# Patient Record
Sex: Male | Born: 1970 | Race: White | Hispanic: Yes | Marital: Married | Smoking: Never smoker
Health system: Southern US, Community
[De-identification: ages and names within clinical notes are randomized; demographics above are authoritative.]

## PROBLEM LIST (undated history)

## (undated) DIAGNOSIS — M1990 Unspecified osteoarthritis, unspecified site: Secondary | ICD-10-CM

## (undated) DIAGNOSIS — E785 Hyperlipidemia, unspecified: Secondary | ICD-10-CM

## (undated) HISTORY — DX: Unspecified osteoarthritis, unspecified site: M19.90

---

## 2012-12-07 ENCOUNTER — Emergency Department (HOSPITAL_COMMUNITY): Payer: Worker's Compensation

## 2012-12-07 ENCOUNTER — Encounter (HOSPITAL_COMMUNITY): Payer: Self-pay | Admitting: Emergency Medicine

## 2012-12-07 ENCOUNTER — Emergency Department (HOSPITAL_COMMUNITY)
Admission: EM | Admit: 2012-12-07 | Discharge: 2012-12-08 | Disposition: A | Discharge status: Home / Self Care | Payer: Worker's Compensation | Attending: Emergency Medicine | Admitting: Emergency Medicine

## 2012-12-07 DIAGNOSIS — S0990XA Unspecified injury of head, initial encounter: Secondary | ICD-10-CM | POA: Insufficient documentation

## 2012-12-07 DIAGNOSIS — R109 Unspecified abdominal pain: Secondary | ICD-10-CM

## 2012-12-07 DIAGNOSIS — R55 Syncope and collapse: Secondary | ICD-10-CM | POA: Insufficient documentation

## 2012-12-07 DIAGNOSIS — R220 Localized swelling, mass and lump, head: Secondary | ICD-10-CM | POA: Insufficient documentation

## 2012-12-07 DIAGNOSIS — M545 Low back pain, unspecified: Secondary | ICD-10-CM | POA: Insufficient documentation

## 2012-12-07 DIAGNOSIS — S0180XA Unspecified open wound of other part of head, initial encounter: Secondary | ICD-10-CM

## 2012-12-07 DIAGNOSIS — S025XXA Fracture of tooth (traumatic), initial encounter for closed fracture: Secondary | ICD-10-CM | POA: Insufficient documentation

## 2012-12-07 DIAGNOSIS — S060X9A Concussion with loss of consciousness of unspecified duration, initial encounter: Secondary | ICD-10-CM

## 2012-12-07 DIAGNOSIS — M549 Dorsalgia, unspecified: Secondary | ICD-10-CM

## 2012-12-07 DIAGNOSIS — S060X0A Concussion without loss of consciousness, initial encounter: Secondary | ICD-10-CM

## 2012-12-07 DIAGNOSIS — R413 Other amnesia: Secondary | ICD-10-CM | POA: Insufficient documentation

## 2012-12-07 LAB — BASIC METABOLIC PANEL
CO2: 24 mEq/L (ref 19–32)
Calcium: 9.2 mg/dL (ref 8.4–10.5)
Chloride: 97 mEq/L (ref 96–112)
Glucose, Bld: 99 mg/dL (ref 70–99)
Sodium: 134 mEq/L — ABNORMAL LOW (ref 135–145)

## 2012-12-07 LAB — CBC WITH DIFFERENTIAL/PLATELET
Eosinophils Relative: 0 % (ref 0–5)
HCT: 44.1 % (ref 39.0–52.0)
Lymphocytes Relative: 15 % (ref 12–46)
Lymphs Abs: 2.5 10*3/uL (ref 0.7–4.0)
MCV: 83.2 fL (ref 78.0–100.0)
Monocytes Absolute: 0.8 10*3/uL (ref 0.1–1.0)
Neutro Abs: 12.9 10*3/uL — ABNORMAL HIGH (ref 1.7–7.7)
Platelets: 231 10*3/uL (ref 150–400)
RBC: 5.3 MIL/uL (ref 4.22–5.81)
WBC: 16.3 10*3/uL — ABNORMAL HIGH (ref 4.0–10.5)

## 2012-12-07 LAB — PROTIME-INR: Prothrombin Time: 13.2 seconds (ref 11.6–15.2)

## 2012-12-07 MED ORDER — PROMETHAZINE HCL 25 MG PO TABS: 25.0000 mg | ORAL_TABLET | Freq: Four times a day (QID) | ORAL | Status: DC | PRN

## 2012-12-07 MED ORDER — MORPHINE SULFATE 4 MG/ML IJ SOLN
4.0000 mg | Freq: Once | INTRAMUSCULAR | Status: AC
  Administered 2012-12-07: 4 mg via INTRAMUSCULAR
  Filled 2012-12-07: qty 1

## 2012-12-07 MED ORDER — HYDROMORPHONE HCL PF 1 MG/ML IJ SOLN
1.0000 mg | Freq: Once | INTRAMUSCULAR | Status: DC
  Filled 2012-12-07: qty 1

## 2012-12-07 MED ORDER — ONDANSETRON HCL 4 MG/2ML IJ SOLN
4.0000 mg | Freq: Once | INTRAMUSCULAR | Status: AC
  Administered 2012-12-07: 4 mg via INTRAVENOUS
  Filled 2012-12-07: qty 2

## 2012-12-07 MED ORDER — OXYCODONE-ACETAMINOPHEN 5-325 MG PO TABS
2.0000 | ORAL_TABLET | Freq: Once | ORAL | Status: AC
  Administered 2012-12-07: 2 via ORAL
  Filled 2012-12-07 (×2): qty 2

## 2012-12-07 MED ORDER — HYDROMORPHONE HCL PF 1 MG/ML IJ SOLN
1.0000 mg | Freq: Once | INTRAMUSCULAR | Status: AC
  Administered 2012-12-07: 1 mg via INTRAVENOUS
  Filled 2012-12-07: qty 1

## 2012-12-07 MED ORDER — HYDROCODONE-ACETAMINOPHEN 5-325 MG PO TABS
1.0000 | ORAL_TABLET | Freq: Once | ORAL | Status: AC
  Administered 2012-12-07: 1 via ORAL
  Filled 2012-12-07: qty 1

## 2012-12-07 MED ORDER — ONDANSETRON 4 MG PO TBDP
8.0000 mg | ORAL_TABLET | Freq: Once | ORAL | Status: AC
  Administered 2012-12-07: 8 mg via ORAL
  Filled 2012-12-07: qty 2

## 2012-12-07 MED ORDER — OXYCODONE-ACETAMINOPHEN 5-325 MG PO TABS: 2.0000 | ORAL_TABLET | Freq: Four times a day (QID) | ORAL | Status: DC | PRN

## 2012-12-07 MED ORDER — MORPHINE SULFATE 4 MG/ML IJ SOLN
4.0000 mg | Freq: Once | INTRAMUSCULAR | Status: AC
  Administered 2012-12-07: 4 mg via INTRAVENOUS
  Filled 2012-12-07: qty 1
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
## 2012-12-07 NOTE — ED Notes (Signed)
While pt was being wheeled out of ER pt had episode of emesis. Pt brought back to tx room and EDPA made aware. Pt stable, sitting in wheelchair, awake and calm. Family remains at bedside. Interpreter phone used to explain situation and medications
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
## 2012-12-07 NOTE — ED Notes (Signed)
During Discharge instructions, pt c/o increased pain and nausea, requesting medication prior to discharge. EDPA made aware, states pt ok for d/c after receiving medication

## 2012-12-07 NOTE — ED Notes (Signed)
Dr. Andrey Campanile at bedside with Interpreter phone
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
## 2012-12-07 NOTE — ED Notes (Signed)
Upon re-entering tx room, pt asleep in bed. Pt easily awakened. Asked pt to rate pain again, pt states his pain is now "Very Little". Medication held, EDPA aware

## 2012-12-08 MED ORDER — PROMETHAZINE HCL 25 MG/ML IJ SOLN
25.0000 mg | Freq: Once | INTRAMUSCULAR | Status: AC
  Administered 2012-12-08: 25 mg via INTRAMUSCULAR
  Filled 2012-12-08: qty 1

## 2012-12-08 NOTE — ED Notes (Signed)
Pt denies nausea at this time, is sitting up in wheelchair, alert, calm, interactive without issues. EDPA states ok for discharge

## 2012-12-08 NOTE — ED Notes (Addendum)
Pt had second episode of emesis.

## 2012-12-08 NOTE — ED Notes (Signed)
Pt reports "very little" nausea at this time, EDPA made aware. Pt placed into wheelchair per EDPA orders. Pt to be observed for 10 minutes to ensure nausea resolution.

## 2012-12-08 NOTE — ED Provider Notes (Signed)
Medical screening examination/treatment/procedure(s) were performed by non-physician practitioner and as supervising physician I was immediately available for consultation/collaboration.   Charles B. Bernette Mayers, MD 12/08/12 1327

## 2014-01-03 ENCOUNTER — Emergency Department (HOSPITAL_COMMUNITY)
Admission: EM | Admit: 2014-01-03 | Discharge: 2014-01-03 | Disposition: A | Discharge status: Home / Self Care | Payer: No Typology Code available for payment source | Attending: Emergency Medicine | Admitting: Emergency Medicine

## 2014-01-03 ENCOUNTER — Emergency Department (HOSPITAL_COMMUNITY): Payer: No Typology Code available for payment source

## 2014-01-03 ENCOUNTER — Encounter (HOSPITAL_COMMUNITY): Payer: Self-pay | Admitting: Emergency Medicine

## 2014-01-03 DIAGNOSIS — Z23 Encounter for immunization: Secondary | ICD-10-CM | POA: Insufficient documentation

## 2014-01-03 DIAGNOSIS — H1130 Conjunctival hemorrhage, unspecified eye: Secondary | ICD-10-CM | POA: Insufficient documentation

## 2014-01-03 DIAGNOSIS — S0510XA Contusion of eyeball and orbital tissues, unspecified eye, initial encounter: Secondary | ICD-10-CM | POA: Insufficient documentation

## 2014-01-03 DIAGNOSIS — S025XXA Fracture of tooth (traumatic), initial encounter for closed fracture: Secondary | ICD-10-CM | POA: Insufficient documentation

## 2014-01-03 DIAGNOSIS — S0993XA Unspecified injury of face, initial encounter: Secondary | ICD-10-CM

## 2014-01-03 DIAGNOSIS — S0003XA Contusion of scalp, initial encounter: Secondary | ICD-10-CM | POA: Insufficient documentation

## 2014-01-03 DIAGNOSIS — S199XXA Unspecified injury of neck, initial encounter: Secondary | ICD-10-CM

## 2014-01-03 DIAGNOSIS — S0083XA Contusion of other part of head, initial encounter: Secondary | ICD-10-CM

## 2014-01-03 DIAGNOSIS — S1093XA Contusion of unspecified part of neck, initial encounter: Principal | ICD-10-CM

## 2014-01-03 MED ORDER — NAPROXEN 500 MG PO TABS: 500.0000 mg | ORAL_TABLET | Freq: Two times a day (BID) | ORAL | Status: DC

## 2014-01-03 MED ORDER — OXYCODONE-ACETAMINOPHEN 5-325 MG PO TABS
2.0000 | ORAL_TABLET | Freq: Once | ORAL | Status: AC
  Administered 2014-01-03: 2 via ORAL
  Filled 2014-01-03: qty 2

## 2014-01-03 MED ORDER — TETANUS-DIPHTH-ACELL PERTUSSIS 5-2.5-18.5 LF-MCG/0.5 IM SUSP
0.5000 mL | Freq: Once | INTRAMUSCULAR | Status: AC
  Administered 2014-01-03: 0.5 mL via INTRAMUSCULAR
  Filled 2014-01-03: qty 0.5
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
## 2014-01-15 NOTE — ED Provider Notes (Signed)
Medical screening examination/treatment/procedure(s) were performed by non-physician practitioner and as supervising physician I was immediately available for consultation/collaboration.   EKG Interpretation None        Julie Manly, MD 01/15/14 0715 

## 2015-08-14 ENCOUNTER — Ambulatory Visit (INDEPENDENT_AMBULATORY_CARE_PROVIDER_SITE_OTHER): Payer: BLUE CROSS/BLUE SHIELD | Admitting: Family Medicine

## 2015-08-14 VITALS — BP 122/72 | HR 67 | Temp 98.1°F | Resp 17 | Ht 65.5 in | Wt 170.0 lb

## 2015-08-14 DIAGNOSIS — J209 Acute bronchitis, unspecified: Secondary | ICD-10-CM

## 2015-08-14 DIAGNOSIS — T1592XA Foreign body on external eye, part unspecified, left eye, initial encounter: Secondary | ICD-10-CM

## 2015-08-14 MED ORDER — HYDROCODONE-HOMATROPINE 5-1.5 MG/5ML PO SYRP: 5.0000 mL | ORAL_SOLUTION | Freq: Three times a day (TID) | ORAL | Status: DC | PRN

## 2015-08-14 MED ORDER — PREDNISONE 20 MG PO TABS: ORAL_TABLET | ORAL | Status: DC

## 2015-08-14 MED ORDER — AMOXICILLIN 875 MG PO TABS: 875.0000 mg | ORAL_TABLET | Freq: Two times a day (BID) | ORAL | Status: DC

## 2015-08-14 NOTE — Patient Instructions (Addendum)
Bronquitis aguda (Acute Bronchitis) La bronquitis es una inflamacin de las vas respiratorias que se extienden desde la trquea [facility] pulmones (bronquios). La inflamacin produce la formacin de mucosidad. Esto produce tos, que es el sntoma ms frecuente de la bronquitis.  Cuando la bronquitis es [place], generalmente comienza de [place] sbita y desaparece luego de un par de semanas. El hbito de fumar, las alergias y el asma pueden empeorar la bronquitis. Los episodios repetidos de bronquitis pueden causar ms problemas pulmonares.  CAUSAS La causa ms frecuente de bronquitis aguda es el mismo virus que produce el resfro. El virus puede propagarse de Ardelia Mems persona a la otra (contagioso) a travs de la tos y los estornudos, y al tocar objetos contaminados. [place].  Cristy Hilts.  Tos con mucosidad.  Dolores [employer] cuerpo.  Congestin en el pecho.  Escalofros.  Falta de aire.  Dolor de Investment banker, operational. DIAGNSTICO  La bronquitis aguda en general se diagnostica con un examen fsico. El mdico tambin le har preguntas sobre su historia clnica. En algunos casos se indican otros estudios, como radiografas, para Clinical research associate.  TRATAMIENTO  La bronquitis aguda generalmente desaparece en un par de semanas. Con frecuencia, no es Systems analyst. Los medicamentos se indican para aliviar la fiebre o la tos. Generalmente, no es necesario el uso de antibiticos, pero pueden recetarse en ciertas ocasiones. En algunos casos, se recomienda el uso de un inhalador para mejorar la falta de aire y Aeronautical engineer tos. Un vaporizador de aire fro podr ayudarlo a [employer] bronquiales y Armed forces technical officer su eliminacin.  INSTRUCCIONES PARA EL CUIDADO EN EL HOGAR  Descanse lo suficiente.  Beba lquidos en abundancia para mantener la orina de color claro o amarillo plido (excepto que padezca una enfermedad que requiera la restriccin de lquidos). El aumento  de lquidos puede ayudar a que las secreciones respiratorias (esputo) sean menos espesas y a reducir la congestin del pecho, y Mining engineer deshidratacin.  Tome los medicamentos solamente como se lo haya indicado el mdico.  Si le recetaron antibiticos, asegrese de terminarlos, incluso si comienza a sentirse mejor.  Evite fumar o aspirar el humo de otros fumadores. La exposicin al humo del cigarrillo o a irritantes qumicos har que la bronquitis empeore. Si fuma, considere el uso de goma de Higher education careers adviser o la aplicacin de parches en la piel que contengan nicotina para Public house manager los sntomas de abstinencia. Si deja de fumar, sus pulmones se curarn ms rpido.  Reduzca la probabilidad de otro episodio de bronquitis aguda lavando sus manos con frecuencia, evitando a las personas que tengan sntomas y tratando de no tocarse las manos con la boca, la nariz o los ojos.  Concurra a todas las visitas de control como se lo haya indicado el mdico. SOLICITE ATENCIN MDICA SI: Los sntomas no mejoran despus de una semana de [place].  SOLICITE ATENCIN MDICA DE INMEDIATO SI:  Comienza a tener fiebre o escalofros cada vez ms intensos.  Siente dolor en el pecho.  Le falta el aire de manera preocupante.  La flema tiene [place].  Se deshidrata.  Se desmaya o siente que va a desmayarse de forma repetida.  Tiene vmitos que se repiten.  Tiene un dolor de cabeza intenso. ASEGRESE DE QUE:   Comprende estas instrucciones.  Controlar su afeccin.  Recibir ayuda de inmediato si no mejora o si empeora.   Esta informacin no tiene Marine scientist el consejo del mdico. Asegrese de hacerle al mdico cualquier  pregunta que tenga.   Document Released: 04/29/2005 Document Revised: 05/20/2014 Elsevier Interactive Patient Education 2016 [employer] Acute Bronchitis Bronchitis is inflammation of the airways that extend from the windpipe into the lungs (bronchi). The inflammation often  causes mucus to develop. This leads to a cough, which is the most common symptom of bronchitis.  In acute bronchitis, the condition usually develops suddenly and goes away over time, usually in a couple weeks. Smoking, allergies, and asthma can make bronchitis worse. Repeated episodes of bronchitis may cause further lung problems.  CAUSES Acute bronchitis is most often caused by the same virus that causes a cold. The virus can spread from person to person (contagious) through coughing, sneezing, and touching contaminated objects. SIGNS AND SYMPTOMS   Cough.   Fever.   Coughing up mucus.   Body aches.   Chest congestion.   Chills.   Shortness of breath.   Sore throat.  DIAGNOSIS  Acute bronchitis is usually diagnosed through a physical exam. Your health care provider will also ask you questions about your medical history. Tests, such as chest X-rays, are sometimes done to rule out other conditions.  TREATMENT  Acute bronchitis usually goes away in a couple weeks. Oftentimes, no medical treatment is necessary. Medicines are sometimes given for relief of fever or cough. Antibiotic medicines are usually not needed but may be prescribed in certain situations. In some cases, an inhaler may be recommended to help reduce shortness of breath and control the cough. A cool mist vaporizer may also be used to help thin bronchial secretions and make it easier to clear the chest.  HOME CARE INSTRUCTIONS  Get plenty of rest.   Drink enough fluids to keep your urine clear or pale yellow (unless you have a medical condition that requires fluid restriction). Increasing fluids may help thin your respiratory secretions (sputum) and reduce chest congestion, and it will prevent dehydration.   Take medicines only as directed by your health care provider.  If you were prescribed an antibiotic medicine, finish it all even if you start to feel better.  Avoid smoking and secondhand smoke. Exposure  to cigarette smoke or irritating chemicals will make bronchitis worse. If you are a smoker, consider using nicotine gum or skin patches to help control withdrawal symptoms. Quitting smoking will help your lungs heal faster.   Reduce the chances of another bout of acute bronchitis by washing your hands frequently, avoiding people with cold symptoms, and trying not to touch your hands to your mouth, nose, or eyes.   Keep all follow-up visits as directed by your health care provider.  SEEK MEDICAL CARE IF: Your symptoms do not improve after 1 week of treatment.  SEEK IMMEDIATE MEDICAL CARE IF:  You develop an increased fever or chills.   You have chest pain.   You have severe shortness of breath.  You have bloody sputum.   You develop dehydration.  You faint or repeatedly feel like you are going to pass out.  You develop repeated vomiting.  You develop a severe headache. MAKE SURE YOU:   Understand these instructions.  Will watch your condition.  Will get help right away if you are not doing well or get worse.   This information is not intended to replace advice given to you by your health care provider. Make sure you discuss any questions you have with your health care provider.   Document Released: 06/06/2004 Document Revised: 05/20/2014 Document Reviewed: 10/20/2012 Elsevier Interactive Patient Education [employer]2016 Elsevier Inc.

## 2015-08-14 NOTE — Progress Notes (Signed)
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 He thinks he got a piece of dust or sand in his left eye yesterday when working.  He's been wheezing but has no history of smoking or asthma. He's has no fever but he does have sinus congestion.  Objective: Left eye was stained and a foreign body was removed from the underside of the lid. He has a small irritation on the lateral aspect of his cornea. EOM is normal. The eye is mildly injected. TMs are normal Nose shows bilateral mucopurulent discharge Chest: Diffuse loud rhonchi bilaterally Heart: Regular no murmur     ICD-9-CM ICD-10-CM   1. Acute bronchitis, unspecified organism 466.0 J20.9 amoxicillin (AMOXIL) 875 MG tablet     HYDROcodone-homatropine (HYCODAN) 5-1.5 MG/5ML syrup     predniSONE (DELTASONE) 20 MG tablet  2. Foreign body of left eye, initial encounter 930.9 T15.92XA    [id number]       Signed, Elvina SidleKurt Parks Czajkowski, MD

## 2016-03-15 LAB — GLUCOSE, POCT (MANUAL RESULT ENTRY): POC GLUCOSE: 119 mg/dL — AB (ref 70–99)

## 2017-01-14 ENCOUNTER — Ambulatory Visit (INDEPENDENT_AMBULATORY_CARE_PROVIDER_SITE_OTHER): Payer: BLUE CROSS/BLUE SHIELD | Admitting: Physician Assistant

## 2017-01-14 ENCOUNTER — Encounter: Payer: Self-pay | Admitting: Physician Assistant

## 2017-01-14 VITALS — BP 124/81 | HR 55 | Temp 98.3°F | Resp 16 | Ht 65.5 in

## 2017-01-14 DIAGNOSIS — Z23 Encounter for immunization: Secondary | ICD-10-CM

## 2017-01-14 DIAGNOSIS — S61211A Laceration without foreign body of left index finger without damage to nail, initial encounter: Secondary | ICD-10-CM | POA: Diagnosis not present

## 2017-01-14 NOTE — Patient Instructions (Addendum)
WOUND CARE Please return in 10 days to have your stitches/staples removed or sooner if you have concerns. . Keep area clean and dry for 24 hours. Do not remove bandage, if applied. . After 24 hours, remove bandage and wash wound gently with mild soap and warm water. Reapply a new bandage after cleaning wound, if directed. . Continue daily cleansing with soap and water until stitches/staples are removed. . Do not apply any ointments or creams to the wound while stitches/staples are in place, as this may cause delayed healing. . Notify the office if you experience any of the following signs of infection: Swelling, redness, pus drainage, streaking, fever >101.0 F . Notify the office if you experience excessive bleeding that does not stop after 15-20 minutes of constant, firm pressure.      IF you received an x-ray today, you will receive an invoice from [facility]. Please contact  Radiology at [phone] with questions or concerns regarding your invoice.   IF you received labwork today, you will receive an invoice from [facility]. Please contact [facility] at [phone] with questions or concerns regarding your invoice.   Our billing staff will not be able to assist you with questions regarding bills from these companies.  You will be contacted with the lab results as soon as they are available. The fastest way to get your results is to activate your My Chart account. Instructions are located on the last page of this paperwork. If you have not heard from us regarding the results in 2 weeks, please contact this office.      

## 2017-01-14 NOTE — Progress Notes (Signed)
    01/14/2017 10:28 AM   DOB: 07/26/1970 / MRN: [id number]  SUBJECTIVE:  Dakota Lin is a 46 y.o. male presenting for left index finger laceration sustained on a light bulb. No loss in strength.  No change in sensation.    He has No Known Allergies.   He  has no past medical history on file.    He  reports that he has never smoked. He has never used smokeless tobacco. He reports that he drinks alcohol. He  has no sexual activity history on file. The patient  has no past surgical history on file.  His family history is not on file.  Review of Systems  Neurological: Negative for dizziness and focal weakness.  Endo/Heme/Allergies: Does not bruise/bleed easily.    The problem list and medications were reviewed and updated by myself where necessary and exist elsewhere in the encounter.   OBJECTIVE:  BP 124/81   Pulse (!) 55   Temp 98.3 F (36.8 C) (Oral)   Resp 16   Ht 5' 5.5" (1.664 m)   SpO2 98%    Physical Exam  Cardiovascular: Normal rate.   Musculoskeletal:       Hands:  Risk and benefits discussed and verbal consent obtained. Anesthetic allergies reviewed. Patient anesthetized using 1:1 mix of 2% lidocaine with epi and Marcaine. The wound was cleansed thoroughly with soap and water. Sterile prep and drape. Wound closed with 5 throws using 4-0 Prolene suture material. Hemostasis achieved. Mupirocin applied to the wound and bandage placed. The patient tolerated well. Wound instructions were provided and the patient is to return in 10 days for suture removal.    No results found for this or any previous visit (from the past 72 hour(s)).  No results found.  ASSESSMENT AND PLAN:  Dakota Lin was seen today for laceration.  Diagnoses and all orders for this visit:  Laceration of left index finger without foreign body without damage to nail, initial encounter: Easily repaired. RTC 10 days fo suture removal.   Other orders -     Tdap vaccine greater than or equal to 7yo  IM    The patient is advised to call or return to clinic if he does not see an improvement in symptoms, or to seek the care of the closest emergency department if he worsens with the above plan.   Deliah BostonMichael [place], MHS, PA-C Primary [facility] at [facility] 01/14/2017 10:28 AM

## 2017-01-16 ENCOUNTER — Encounter: Payer: Self-pay | Admitting: Family Medicine

## 2017-01-16 ENCOUNTER — Ambulatory Visit (INDEPENDENT_AMBULATORY_CARE_PROVIDER_SITE_OTHER): Payer: BLUE CROSS/BLUE SHIELD | Admitting: Family Medicine

## 2017-01-16 VITALS — BP 126/80 | HR 63 | Temp 98.1°F | Resp 18 | Ht 65.75 in | Wt 185.6 lb

## 2017-01-16 DIAGNOSIS — Z0000 Encounter for general adult medical examination without abnormal findings: Secondary | ICD-10-CM | POA: Diagnosis not present

## 2017-01-16 DIAGNOSIS — Z23 Encounter for immunization: Secondary | ICD-10-CM

## 2017-01-16 NOTE — Progress Notes (Signed)
9/6/20189:05 AM  Dakota Lin 01/04/1971, 46 y.o. male [id number]  Chief Complaint  Patient presents with  . Establish Care    HPI:   Patient is a 46 y.o. male with no significant past medical history who presents today for annual physical. He has no acute concerns today. He is requesting routine blood work including prostate cancer screening given age.    [facility] 01/16/2017 01/14/2017 08/14/2015  Decreased Interest 0 0 0  Down, Depressed, Hopeless 0 0 0  PHQ - 2 Score 0 0 0    No Known Allergies  No current outpatient prescriptions on file prior to visit.   No current facility-administered medications on file prior to visit.     History reviewed. No pertinent past medical history.  History reviewed. No pertinent surgical history.  Social History  Substance Use Topics  . Smoking status: Never Smoker  . Smokeless tobacco: Never Used  . Alcohol use Yes     Comment: occ    Family History  Problem Relation Age of Onset  . Stroke Father     Review of Systems  Constitutional: Negative for chills and fever.  HENT: Negative for congestion, ear pain and sore throat.   Eyes: Negative for blurred vision.  Respiratory: Negative for cough and shortness of breath.   Cardiovascular: Negative for chest pain, palpitations and leg swelling.  Gastrointestinal: Positive for heartburn. Negative for abdominal pain, blood in stool, constipation, diarrhea, melena, nausea and vomiting.  Genitourinary: Positive for frequency. Negative for dysuria, hematuria and urgency.  Musculoskeletal: Negative for joint pain and myalgias.  Neurological: Negative for sensory change, speech change and focal weakness.  Endo/Heme/Allergies: Negative for polydipsia.  Psychiatric/Behavioral: Negative for depression. The patient is not nervous/anxious.      OBJECTIVE:  Blood pressure 126/80, pulse 63, temperature (!) 97.1 F (36.2 C), temperature source Oral, resp. rate 18, height 5'  5.75" (1.67 m), weight 185 lb 9.6 oz (84.2 kg), SpO2 97 %. Repeat Temp 98.1  Physical Exam  Constitutional: He is oriented to person, place, and time and well-developed, well-nourished, and in no distress.  HENT:  Head: Normocephalic and atraumatic.  Right Ear: Hearing, tympanic membrane, external ear and ear canal normal.  Left Ear: Hearing, tympanic membrane, external ear and ear canal normal.  Mouth/Throat: Oropharynx is clear and moist. No oropharyngeal exudate.  Eyes: Pupils are equal, round, and reactive to light. Conjunctivae and EOM are normal.  Neck: Neck supple.  Cardiovascular: Normal rate and regular rhythm.  Exam reveals no gallop and no friction rub.   No murmur heard. Pulmonary/Chest: Effort normal and breath sounds normal. He has no wheezes. He has no rales.  Abdominal: Soft. Bowel sounds are normal. He exhibits no distension and no mass. There is no tenderness.  Musculoskeletal: Normal range of motion. He exhibits no edema.  Lymphadenopathy:    He has no cervical adenopathy.  Neurological: He is alert and oriented to person, place, and time. He has normal reflexes. Gait normal.  Skin: Skin is warm and dry.       ASSESSMENT and PLAN:  1. Annual physical exam No concerns per history nor exam. Routine healthcare maintenance fasting labs done today. - CBC with Differential - Comprehensive metabolic panel - Lipid panel - TSH - PSA Total (Reflex To Free)  2. Need for immunization against influenza Given today - Flu Vaccine QUAD 36+ mos IM      Myles LippsIrma M Santiago, MD [facility] [address] [place] [address] Ph.  [phone] Fax [phone]

## 2017-01-16 NOTE — Patient Instructions (Signed)
     IF you received an x-ray today, you will receive an invoice from [facility]. Please contact  Radiology at [phone] with questions or concerns regarding your invoice.   IF you received labwork today, you will receive an invoice from [facility]. Please contact [facility] at [phone] with questions or concerns regarding your invoice.   Our billing staff will not be able to assist you with questions regarding bills from these companies.  You will be contacted with the lab results as soon as they are available. The fastest way to get your results is to activate your My Chart account. Instructions are located on the last page of this paperwork. If you have not heard from us regarding the results in 2 weeks, please contact this office.     

## 2017-01-17 ENCOUNTER — Encounter: Payer: Self-pay | Admitting: Family Medicine

## 2017-01-17 LAB — COMPREHENSIVE METABOLIC PANEL
ALT: 13 IU/L (ref 0–44)
AST: 11 IU/L (ref 0–40)
Albumin/Globulin Ratio: 1.3 (ref 1.2–2.2)
Albumin: 4.2 g/dL (ref 3.5–5.5)
Alkaline Phosphatase: 94 IU/L (ref 39–117)
BUN/Creatinine Ratio: 14 (ref 9–20)
BUN: 12 mg/dL (ref 6–24)
Bilirubin Total: 0.4 mg/dL (ref 0.0–1.2)
CO2: 22 mmol/L (ref 20–29)
Calcium: 9 mg/dL (ref 8.7–10.2)
Chloride: 106 mmol/L (ref 96–106)
Creatinine, Ser: 0.88 mg/dL (ref 0.76–1.27)
GFR calc Af Amer: 118 mL/min/{1.73_m2} (ref >59)
GFR calc non Af Amer: 102 mL/min/{1.73_m2} (ref >59)
Globulin, Total: 3.2 g/dL (ref 1.5–4.5)
Glucose: 91 mg/dL (ref 65–99)
Potassium: 4.6 mmol/L (ref 3.5–5.2)
Sodium: 142 mmol/L (ref 134–144)
Total Protein: 7.4 g/dL (ref 6.0–8.5)

## 2017-01-17 LAB — CBC WITH DIFFERENTIAL/PLATELET
Basophils Absolute: 0 10*3/uL (ref 0.0–0.2)
Basos: 0 %
EOS (ABSOLUTE): 0.2 10*3/uL (ref 0.0–0.4)
Eos: 2 %
Hematocrit: 43.5 % (ref 37.5–51.0)
Hemoglobin: 15 g/dL (ref 13.0–17.7)
Immature Grans (Abs): 0 10*3/uL (ref 0.0–0.1)
Immature Granulocytes: 0 %
Lymphocytes Absolute: 2.3 10*3/uL (ref 0.7–3.1)
Lymphs: 28 %
MCH: 29.1 pg (ref 26.6–33.0)
MCHC: 34.5 g/dL (ref 31.5–35.7)
MCV: 84 fL (ref 79–97)
Monocytes Absolute: 0.5 10*3/uL (ref 0.1–0.9)
Monocytes: 6 %
Neutrophils Absolute: 5.3 10*3/uL (ref 1.4–7.0)
Neutrophils: 64 %
Platelets: 251 10*3/uL (ref 150–379)
RBC: 5.16 x10E6/uL (ref 4.14–5.80)
RDW: 13.7 % (ref 12.3–15.4)
WBC: 8.2 10*3/uL (ref 3.4–10.8)

## 2017-01-17 LAB — PSA TOTAL (REFLEX TO FREE): Prostate Specific Ag, Serum: 0.2 ng/mL (ref 0.0–4.0)

## 2017-01-17 LAB — LIPID PANEL
Chol/HDL Ratio: 6.3 ratio — ABNORMAL HIGH (ref 0.0–5.0)
Cholesterol, Total: 196 mg/dL (ref 100–199)
HDL: 31 mg/dL — ABNORMAL LOW (ref >39)
LDL Calculated: 127 mg/dL — ABNORMAL HIGH (ref 0–99)
Triglycerides: 190 mg/dL — ABNORMAL HIGH (ref 0–149)
VLDL Cholesterol Cal: 38 mg/dL (ref 5–40)

## 2017-01-17 LAB — TSH: TSH: 1.98 u[IU]/mL (ref 0.450–4.500)

## 2017-01-23 ENCOUNTER — Encounter: Payer: Self-pay | Admitting: Urgent Care

## 2017-01-23 ENCOUNTER — Ambulatory Visit (INDEPENDENT_AMBULATORY_CARE_PROVIDER_SITE_OTHER): Payer: BLUE CROSS/BLUE SHIELD | Admitting: Urgent Care

## 2017-01-23 VITALS — BP 100/69 | Temp 98.6°F

## 2017-01-23 DIAGNOSIS — S61211D Laceration without foreign body of left index finger without damage to nail, subsequent encounter: Secondary | ICD-10-CM

## 2017-01-23 DIAGNOSIS — Z4802 Encounter for removal of sutures: Secondary | ICD-10-CM

## 2017-01-23 NOTE — Patient Instructions (Addendum)
Remocin de la sutura, cuidados posteriores (Suture Removal, Care After) Siga estas instrucciones durante las prximas semanas. Estas indicaciones le proporcionan informacin general acerca de cmo deber cuidarse despus del procedimiento. El mdico tambin podr darle instrucciones ms especficas. El tratamiento se ha planificado de acuerdo a las prcticas mdicas actuales, pero a veces se producen problemas. Comunquese con el mdico si tiene algn problema o tiene dudas despus del procedimiento. QU ESPERAR DESPUS DEL PROCEDIMIENTO Despus de que le retiren los puntos (suturas), es normal experimentar lo siguiente:  Molestias e hinchazn en la zona de la herida.  Leve enrojecimiento en la zona de la herida. INSTRUCCIONES PARA EL CUIDADO EN EL HOGAR  Si tiene bandas adhesivas en la piel sobre la zona de la herida, no las retire. Se caern solas en unos Hartford Financialpocos das. Si las bandas Agilent Technologiesadhesivas siguen en su lugar despus de [address]14 das, entonces puede retirarlas.  Cambie los apsitos (vendajes), al menos, una vez al da o segn las instrucciones del mdico. Si el vendaje se adhiere, remjelo con agua jabonosa tibia.  Solo aplique un ungento o crema segn las indicaciones del mdico. Si [place] crema o ungento, lave la zona con agua y [address] dos veces por da para quitarlo todo. Enjuague el jabn y seque suavemente la zona con una toalla limpia.  Mantenga la herida limpia y seca. Si el vendaje se moja, se ensucia o tiene mal olor, cmbielo tan pronto como pueda.  Contine protegiendo la herida de lesiones.  Use protector solar cuando salga al sol. Las cicatrices nuevas se broncean fcilmente.  SOLICITE ATENCIN MDICA SI:  Aumenta el enrojecimiento, la hinchazn o el dolor en la zona afectada.  Observa que sale pus de la herida.  Tiene fiebre.  Advierte un olor ftido que proviene de la herida o del vendaje.  La herida se abre (los bordes se separan).  Esta informacin no tiene Microbiologistcomo fin  reemplazar el consejo del mdico. Asegrese de hacerle al mdico cualquier pregunta que tenga. Document Released: 02/06/2005 Document Revised: 02/17/2013 Document Reviewed: 12/09/2012 Elsevier Interactive Patient Education  2017 [employer].     IF you received an x-ray today, you will receive an invoice from [facility]. Please contact [facility] at [phone] with questions or concerns regarding your invoice.   IF you received labwork today, you will receive an invoice from [facility]. Please contact [facility] at [phone] with questions or concerns regarding your invoice.   Our billing staff will not be able to assist you with questions regarding bills from these companies.  You will be contacted with the lab results as soon as they are available. The fastest way to get your results is to activate your My Chart account. Instructions are located on the last page of this paperwork. If you have not heard from us regarding the results in 2 weeks, please contact this office.

## 2017-01-23 NOTE — Progress Notes (Signed)
   Patient: Dakota Lin [id number]  Subjective: [name] is returning for suture removal. Patient was initially seen 01/14/2017 and had 5 sutures placed. Denies fever, drainage of pus or blood, wound dehiscence, edema, pain.   Objective: BP 100/69 (BP Location: Right Arm, Patient Position: Sitting, Cuff Size: Large)   Temp 98.6 F (37 C) (Oral)    Physical Exam  Constitutional: He is oriented to person, place, and time. He appears well-developed and well-nourished.  Cardiovascular: Normal rate.   Pulmonary/Chest: Effort normal.  Musculoskeletal:       Hands: Neurological: He is alert and oriented to person, place, and time.   #5 sutures removed without incident. Patient tolerated this well.  Assessment and Plan: Well-healed wound. Anticipatory guidance provided. Return to clinic as needed.  Wallis BambergMario Suriya Kovarik, PA-C [facility] [phone] 01/23/2017  8:16 AM

## 2017-05-22 ENCOUNTER — Ambulatory Visit: Payer: BLUE CROSS/BLUE SHIELD | Admitting: Urgent Care

## 2017-05-22 ENCOUNTER — Encounter: Payer: Self-pay | Admitting: Urgent Care

## 2017-05-22 ENCOUNTER — Ambulatory Visit (INDEPENDENT_AMBULATORY_CARE_PROVIDER_SITE_OTHER): Payer: BLUE CROSS/BLUE SHIELD

## 2017-05-22 VITALS — BP 126/60 | HR 71 | Temp 97.8°F | Resp 16 | Ht 65.0 in | Wt 180.0 lb

## 2017-05-22 DIAGNOSIS — H579 Unspecified disorder of eye and adnexa: Secondary | ICD-10-CM

## 2017-05-22 DIAGNOSIS — M25512 Pain in left shoulder: Secondary | ICD-10-CM | POA: Diagnosis not present

## 2017-05-22 DIAGNOSIS — M19012 Primary osteoarthritis, left shoulder: Secondary | ICD-10-CM | POA: Diagnosis not present

## 2017-05-22 DIAGNOSIS — R6889 Other general symptoms and signs: Secondary | ICD-10-CM

## 2017-05-22 DIAGNOSIS — R52 Pain, unspecified: Secondary | ICD-10-CM | POA: Diagnosis not present

## 2017-05-22 DIAGNOSIS — R0989 Other specified symptoms and signs involving the circulatory and respiratory systems: Secondary | ICD-10-CM | POA: Diagnosis not present

## 2017-05-22 LAB — POCT INFLUENZA A/B
Influenza A, POC: NEGATIVE
Influenza B, POC: NEGATIVE

## 2017-05-22 MED ORDER — PSEUDOEPHEDRINE HCL ER 120 MG PO TB12: 120.0000 mg | ORAL_TABLET | Freq: Two times a day (BID) | ORAL | 3 refills | Status: DC

## 2017-05-22 MED ORDER — AZELASTINE HCL 0.05 % OP SOLN: 1.0000 [drp] | Freq: Two times a day (BID) | OPHTHALMIC | 12 refills | Status: DC

## 2017-05-22 MED ORDER — MELOXICAM 7.5 MG PO TABS: 7.5000 mg | ORAL_TABLET | Freq: Every day | ORAL | 2 refills | Status: DC

## 2017-05-22 NOTE — Progress Notes (Signed)
MRN: [id number] DOB: 06/24/1970  Subjective:   Dakota Lin is a 47 y.o. male presenting for 2 chief complaints.  URI - Reports 2 day history of runny nose, fever (highest 105F), post-nasal drainage, body aches, sinus pain, loose stools (5 episodes yesterday, now improved). Reports foreign body sensation of his right eye, itching. Has tried APAP with relief of fever. Denies ear pain, ear drainage, cough, chest pain, shob, wheezing, n/v, abdominal pain, eye trauma, redness, photophobia, eye drainage, blurred vision.  Arm - Reports 6 month history of persistent left shoulder pain. Has used Advil with good relief. He can still lift items, use his shoulder but continues to have intermittent achy and sharp shoulder pain. Denies trauma, redness, swelling.   Dakota Lin is not currently taking any medications and has No Known Allergies.  Dakota Lin denies past medical and surgical history.   Objective:   Vitals: BP 126/60   Pulse 71   Temp 97.8 F (36.6 C) (Oral)   Resp 16   Ht 5\' 5"  (1.651 m)   Wt 180 lb (81.6 kg)   SpO2 98%   BMI 29.95 kg/m    Visual Acuity Screening   Right eye Left eye Both eyes  Without correction: 20/25 20/25 20/25   With correction:       Physical Exam  Constitutional: He is oriented to person, place, and time. He appears well-developed and well-nourished.  HENT:  TM's intact bilaterally, no effusions or erythema. Nasal turbinates erythematous, nasal passages patent. Mild bilateral maxillary sinus tenderness. Oropharynx with mild post-nasal drainage, mucous membranes moist.   Eyes: Lids are everted and swept, no foreign bodies found. Right eye exhibits no discharge. Left eye exhibits no discharge.  Right conjunctiva slightly injected.  Neck: Normal range of motion. Neck supple.  Cardiovascular: Normal rate.  Pulmonary/Chest: Effort normal.  Musculoskeletal:       Left shoulder: He exhibits tenderness (over AC joint). He exhibits normal range of motion, no bony  tenderness, no swelling, no effusion, no crepitus, no deformity, no laceration, no pain, no spasm and normal strength.  Unable to perform complete shoulder exam (Hawkin, Neer, Empty Beer Can tests) as patient was unable to follow exam techniques.  Lymphadenopathy:    He has no cervical adenopathy.  Neurological: He is alert and oriented to person, place, and time.  Skin: Skin is warm and dry.  Psychiatric:  Anxious demeanor.   Dg Shoulder Left  Result Date: 05/22/2017 CLINICAL DATA:  Left shoulder pain. EXAM: LEFT SHOULDER - 2+ VIEW COMPARISON:  No prior. FINDINGS: Mild acromioclavicular and glenohumeral degenerative change. No evidence of fracture or dislocation. No acute abnormality identified. IMPRESSION: Mild acromioclavicular glenohumeral degenerative change. No acute abnormality. Electronically Signed   By: Maisie Fushomas  Register   On: 05/22/2017 11:49   Results for orders placed or performed in visit on 05/22/17 (from the past 24 hour(s))  POCT Influenza A/B     Status: None   Collection Time: 05/22/17 11:39 AM  Result Value Ref Range   Influenza A, POC Negative Negative   Influenza B, POC Negative Negative   Eye Exam: Eyelids everted and swept for foreign body. The eye was stained with fluorescein. Examination under woods lamp does not reveal a foreign body or area of increased stain uptake. The eye was then irrigated copiously with saline.   Assessment and Plan :   Flu-like symptoms  Generalized body aches - Plan: POCT Influenza A/B  Sensation of foreign body in eye  Runny nose  Pain in joint of  left shoulder - Plan: DG Shoulder Left  Osteoarthritis of left shoulder, unspecified osteoarthritis type  Start supportive care for viral illness. Counseled on diagnosis of arthritis. Start meloxicam. Return-to-clinic precautions discussed, patient verbalized understanding.   Wallis Bamberg, PA-C Primary Care at [facility] [phone] 05/22/2017  11:04 AM

## 2017-05-22 NOTE — Patient Instructions (Addendum)
Para el dolor de garganta intente usar un t de miel. Use 3 cucharaditas de miel con jugo exprimido de [employer]. Coloque las piezas de [place] en 1/2 - 1 taza de agua y caliente sobre la estufa. Luego mezcle los ingredientes y repita cada 4 horas. Tome 500mg  de Tylenol cada 6 horas con comida para dolor y inflammacion.     Infeccin de las vas areas superiores en los adultos. Upper Respiratory Infection, Adult La mayora de las infecciones del tracto respiratorio superior son infecciones virales de las vas que llevan el aire a los pulmones. Un infeccin del tracto respiratorio superior afecta la nariz, la garganta y las vas respiratorias superiores. El tipo ms frecuente de infeccin del tracto respiratorio superior es la nasofaringitis, que habitualmente se conoce como "resfro comn". Las infecciones del tracto respiratorio superior siguen su curso y por lo general se curan solas. En la [employer], la infeccin del tracto respiratorio superior no requiere atencin [place], Biomedical engineer a veces, despus de una infeccin viral, puede surgir una infeccin bacteriana en las vas respiratorias superiores. Esto se conoce como infeccin secundaria. Las infecciones sinusales y en el odo medio son tipos frecuentes de infecciones secundarias en el tracto respiratorio superior. La neumona bacteriana tambin puede complicar una infeccin del tracto respiratorio superior. Este tipo de infeccin puede empeorar el asma y la enfermedad pulmonar obstructiva crnica (EPOC). En algunos casos, estas complicaciones pueden requerir atencin mdica de emergencia y poner en peligro la vida. Cules son las causas? Casi todas las infecciones del tracto respiratorio superior se deben a los virus. Un virus es un tipo de germen que puede contagiarse de Neomia Dear persona a Educational psychologist. Qu incrementa el riesgo? Puede estar en riesgo de sufrir una infeccin del tracto respiratorio superior si:  Fuma.  Tiene una  enfermedad pulmonar o cardaca crnica.  Tiene debilitado el sistema de defensa (inmunitario) del cuerpo.  Es [address] o de edad muy [place].  Tiene asma o alergias nasales.  Trabaja en reas donde hay mucha gente o poca ventilacin.  Rudi Coco en una escuela o en un centro de atencin mdica.  Cules son los signos o los sntomas? Habitualmente, los sntomas aparecen de 2a 3das despus de entrar en contacto con el virus del resfro. La mayora de las infecciones virales en el tracto respiratorio superior duran de 7a 10das. Sin embargo, las infecciones virales en el tracto respiratorio superior a causa del virus de la gripe pueden durar de 14a 18das y, habitualmente, son ms graves. Entre los sntomas se pueden incluir los siguientes:  Secrecin o congestin nasal.  Estornudos.  Tos.  Dolor de Advertising copywriter.  Dolor de [place].  Fatiga.  Grant Ruts.  Prdida del apetito.  Dolor en la frente, detrs de los ojos y por encima de los pmulos (dolor sinusal).  Dolores musculares.  Cmo se diagnostica? El mdico puede diagnosticar una infeccin del tracto respiratorio superior mediante los siguientes estudios:  Examen fsico.  Pruebas para verificar si los sntomas no se deben a otra afeccin, por ejemplo: ? Faringitis estreptoccica. ? Sinusitis. ? Neumona. ? Asma.  Cmo se trata? Esta infeccin desaparece sola con el tiempo. No puede curarse con medicamentos, pero a menudo se prescriben para aliviar los sntomas. Los medicamentos pueden ser tiles para lo siguiente:  Reduccin de la fiebre.  Reduccin de la tos.  Alivio de la congestin nasal.  Siga estas instrucciones en su casa:  Tome los medicamentos solamente como se lo haya indicado el mdico.  A fin  de Engineer, materials de garganta, haga grgaras con solucin salina templada o consuma caramelos para la tos segn lo que le haya indicado el mdico.  Use un humidificador con vapor clido o inhale el vapor  de la ducha para aumentar la humedad del aire. Esto facilitar la respiracin.  Beba suficiente lquido para Photographer orina clara o de color amarillo plido.  Consuma sopas y otros caldos transparentes, y Abbott Laboratories.  Descanse todo lo que sea necesario.  Regrese al Aleen Campi cuando la temperatura se le haya normalizado o segn lo que le indique el mdico. Es posible que deba quedarse en su casa durante un tiempo prolongado, para no infectar a los dems. Tambin puede usar un barbijo y lavarse las manos con cuidado para Transport planner propagacin del virus.  Aumente el uso del inhalador si tiene asma.  No consuma ningn producto que contenga tabaco, lo que incluye cigarrillos, tabaco de Theatre manager o Administrator, Civil Service. Si necesita ayuda para dejar de fumar, consulte al mdico. Cmo se evita? La mejor manera de protegerse de un resfro es con la prctica de una higiene [place].  Evite el contacto por va oral o a travs de las manos con personas que tengan sntomas de resfro.  En caso de contacto, lvese las manos con frecuencia.  No hay pruebas claras de que la vitaminaC, la vitaminaE, la equincea o el ejercicio reduzcan la probabilidad de Primary school teacher un resfro. Sin embargo, siempre se recomienda Insurance account manager, hacer ejercicio y Engineering geologist. Comunquese con un mdico si:  Su Training and development officer.  Los medicamentos no Estate agent.  Tiene escalofros.  Experimenta un empeoramiento en la falta de aire.  Tiene mucosidad marrn o roja.  Tiene secrecin nasal amarilla o marrn.  Le duele la cara, especialmente al inclinarse hacia adelante.  Tiene fiebre.  Tiene los ganglios del cuello hinchados.  Siente dolor al tragar.  Tiene zonas blancas en la parte de atrs de la garganta. Solicite ayuda de inmediato si:  Tiene sntomas intensos o persistentes de: ? Dolor de [place]. ? Dolor de odo. ? Dolor sinusal. ? Engineer, manufacturing.  Tiene enfermedad pulmonar crnica y cualquiera de estos sntomas: ? Sibilancias. ? Tos prolongada. ? Tos con Montez Hageman. ? Cambio en la mucosidad habitual.  Presenta rigidez en el cuello.  Tiene cambios en: ? La visin. ? La audicin. ? El pensamiento. ? El [place] de nimo. Esta informacin no tiene Theme park manager el consejo del mdico. Asegrese de hacerle al mdico cualquier pregunta que tenga. Document Released: 02/06/2005 Document Revised: 08/14/2016 Document Reviewed: 08/04/2013 Elsevier Interactive Patient Education  2018 [employer].     Artrosis Osteoarthritis La artrosis es un tipo de reumatismo articular que afecta el tejido que cubre los extremos de los huesos en las articulaciones (cartlago). El cartlago acta como amortiguador [employer] Corporation y los ayuda a moverse con suavidad. La artrosis se produce cuando el cartlago de las articulaciones se gasta. A veces, la artrosis se denomina reumatismo articular "por uso y desgaste". La artrosis es la forma ms frecuente de reumatismo articular. A menudo, afecta a las Smith International. Es una enfermedad que empeora con el tiempo (una enfermedad progresiva). Esta enfermedad afecta con ms frecuencia las articulaciones de:  Los dedos de Washington Mutual.  Los dedos Avaya.  Las caderas.  Las rodillas.  La columna vertebral, incluido el cuello y la zona lumbar.  Cules son las causas? Esta enfermedad es causada por el  desgaste del cartlago que cubre los extremos de los [place]uesos, lo cual tiene relacin con la edad. Qu incrementa el riesgo? Los siguientes factores pueden hacer que usted sea ms propenso a tener esta enfermedad:  Edad avanzada.  Tener exceso de [place]peso u obesidad.  Uso excesivo de las articulaciones, como en el caso de los Ocean Viewatletas.  Lesin pasada de Risk analystuna articulacin.  Ciruga pasada en una articulacin.  Antecedentes familiares de artrosis.  Cules son los signos o los sntomas? Los  principales sntomas de esta enfermedad son dolor, hinchazn y Geophysical data processorrigidez en la articulacin. Con el tiempo, la articulacin puede perder su forma. Se pueden desprender pequeos trozos de Dow Chemicalhueso o cartlago y flotar dentro de la articulacin, lo cual puede causar ms dolor y Clinical cytogeneticistdao en la articulacin. Pueden formarse pequeos depsitos de hueso (ostefitos) en los extremos de Nurse, learning disabilityla articulacin. Otros sntomas pueden incluir lo siguiente:  Una sensacin de chirrido o raspado dentro de la articulacin al moverla.  Sonidos de chasquido o crujido al Clorox Companymoverse.  Los sntomas pueden afectar una o ms articulaciones. La artrosis en una articulacin principal, como la rodilla o la cadera, puede causar dolor al caminar o al realizar ejercicio. Si tiene artrosis Jabil Circuiten las manos, es posible que no pueda agarrar objetos, torcer la mano o controlar pequeos movimientos de las manos y los dedos (motricidad fina). Cmo se diagnostica? Esta enfermedad se puede diagnosticar en funcin de lo siguiente:  Sus antecedentes mdicos.  Un examen fsico.  Sus sntomas.  Radiografas de la(s) articulacin(es) afectada(s).  Anlisis de sangre para descartar otros tipos de reumatismo articular.  Cmo se trata? No hay cura para esta enfermedad, pero el tratamiento puede ayudar a Human resources officercontrolar el dolor y Scientist, clinical (histocompatibility and immunogenetics)mejorar el funcionamiento de Nurse, learning disabilityla articulacin. Los planes de tratamiento pueden incluir lo siguiente:  Un programa de ejercicios indicado que permita el descanso y el alivio de la articulacin. Puede trabajar con un fisioterapeuta.  Un plan de control del peso.  Tcnicas de Occidental Petroleumalivio del dolor, como las siguientes: ? Aplicacin de calor y fro en la articulacin. ? Impulsos elctricos aplicados a las terminaciones nerviosas que se encuentran debajo de la piel (neuroestimulacin elctrica transcutnea [TENS]). ? Masajes. ? Ciertos suplementos nutricionales.  Antiiflamatorios no esteroideos o medicamentos recetados para ayudar a  Engineer, materialsaliviar el dolor.  Medicamentos para ayudar a Engineer, materialsaliviar el dolor y la inflamacin (corticoesteroides). Estos se pueden administrar por boca (va oral) o mediante una inyeccin.  Dispositivos de [place] Grenadinesayuda, como un dispositivo ortopdico, una frula, un guante especial o un bastn.  Ciruga, como: ? Carolin SicksUna osteotoma. Se hace para reposicionar los huesos y Engineer, materialsaliviar el dolor o para retirar los trozos sueltos de hueso y TEFL teachercartlago. ? Ciruga de reemplazo articular. Es posible que necesite esta ciruga si tiene una artrosis muy grave (avanzada).  Siga estas instrucciones en su casa: Actividad  Haga descansar las articulaciones afectadas segn las indicaciones del mdico.  No conduzca ni use maquinaria pesada mientras toma analgsicos recetados.  Practique los ejercicios que le indiquen. Es posible que el mdico o el fisioterapeuta le recomienden tipos especficos de ejercicios, tales como: ? Ejercicios de fortalecimiento. Se realizan para fortalecer los msculos que sostienen las articulaciones afectadas por el reumatismo. Pueden realizarse con peso o con bandas para agregar resistencia. ? Actividades Rhona Raideraerbicas. Son ejercicios, como caminar a paso ligero o hacer gimnasia Cook Islandsaerbica acutica, que aumentan la actividad del corazn. ? Actividades de amplitud de movimientos. Facilitan el movimiento de las articulaciones. ? Ejercicios de equilibrio y Russian Federationagilidad. Control del dolor, la rigidez  y la hinchazn  Si se lo indican, aplique calor en la zona afectada tan frecuentemente como se lo haya indicado el mdico. Use la fuente de calor que el mdico le recomiende, como una compresa de calor hmedo o una almohadilla trmica. ? Si tiene un dispositivo de ayuda que se puede quitar, quteselo segn lo indicado por su mdico. ? Colquese una toalla entre la piel y la fuente de Airline pilot. Si el mdico le indica que no se quite el dispositivo de USAA se Tax inspector, coloque una toalla entre el dispositivo de [place] y  la fuente de Airline pilot. ? Aplique el calor durante 20 a . ? Retire la fuente de calor si la piel se le pone de color rojo brillante. Esto es muy importante si no puede sentir el dolor, el calor ni el fro. Puede correr un riesgo mayor de sufrir quemaduras.  Si se lo indican, aplique hielo sobre la articulacin afectada: ? Si tiene un dispositivo de ayuda que se puede quitar, quteselo segn lo indicado por su mdico. ? Ponga el hielo en una bolsa plstica. ? Colquese una FirstEnergy Corp piel y la bolsa de hielo. Si el mdico le indica que no se quite el dispositivo de USAA se aplica hielo, coloque una toalla entre el dispositivo de [place] y la bolsa de hielo. ? Coloque el hielo durante , 2 o 3veces por da. Instrucciones generales  Baxter International de venta libre y los recetados solamente como se lo haya indicado el mdico.  Mantenga un peso saludable. Siga las instrucciones de su mdico con respecto al control del [place]. Estas pueden incluir restricciones en la dieta.  No consuma ningn producto que contenga nicotina o tabaco, como cigarrillos y Administrator, Civil Service. Estos pueden retrasar la consolidacin del [place]. Si necesita ayuda para dejar de fumar, consulte al American Express.  Use los dispositivos de American Express se lo haya indicado el mdico.  Concurra a todas las visitas de control como se lo haya indicado el mdico. Esto es importante. Dnde encontrar ms informacin:  Training and development officer de Reumatismo Articular y Event organiser Musculoesquelticas y Arboriculturist [facility] of Arthritis and Musculoskeletal and Skin Diseases): www.niams.http://www.myers.net/  Instituto [employer] el Envejecimiento ([employer] on Aging): https://walker.com/  Instituto Estadounidense de Advice worker of Rheumatology): www.rheumatology.org Comunquese con un mdico si:  La piel se pone roja.  Le aparece una erupcin cutnea.  Siente un dolor que  [place].  Tiene fiebre y siente dolor en la articulacin o el msculo. Solicite ayuda de inmediato si:  [employer] Utilities.  Pierde el apetito repentinamente.  Tiene sudoracin nocturna. Resumen  La artrosis es un tipo de reumatismo articular que afecta el tejido que cubre los extremos de los huesos en las articulaciones (cartlago).  Esta enfermedad es causada por el desgaste del cartlago que cubre los extremos de [place], lo cual tiene relacin con la edad.  Los principales sntomas de esta enfermedad son dolor, hinchazn y Geophysical data processor.  No hay cura para esta enfermedad, pero el tratamiento puede ayudar a Human resources officer y Scientist, clinical (histocompatibility and immunogenetics) el funcionamiento de Nurse, learning disability. Esta informacin no tiene Theme park manager el consejo del mdico. Asegrese de hacerle al mdico cualquier pregunta que tenga. Document Released: 02/06/2005 Document Revised: 03/25/2016 Document Reviewed: 01/04/2013 Elsevier Interactive Patient Education  2018 Arvin[employer]itor.     IF you received an x-ray today, you will receive an invoice from [facility]. Please contact [facility] at [phone] with questions or concerns  regarding your invoice.   IF you received labwork today, you will receive an invoice from [facility]. Please contact [facility] at [phone] with questions or concerns regarding your invoice.   Our billing staff will not be able to assist you with questions regarding bills from these companies.  You will be contacted with the lab results as soon as they are available. The fastest way to get your results is to activate your My Chart account. Instructions are located on the last page of this paperwork. If you have not heard from [place] regarding the results in 2 weeks, please contact this office.

## 2017-09-26 ENCOUNTER — Other Ambulatory Visit: Payer: Self-pay

## 2017-09-26 ENCOUNTER — Encounter: Payer: Self-pay | Admitting: Family Medicine

## 2017-09-26 ENCOUNTER — Ambulatory Visit: Payer: BLUE CROSS/BLUE SHIELD | Admitting: Family Medicine

## 2017-09-26 VITALS — BP 114/80 | HR 73 | Temp 98.3°F | Ht 64.57 in | Wt 179.8 lb

## 2017-09-26 DIAGNOSIS — H538 Other visual disturbances: Secondary | ICD-10-CM | POA: Diagnosis not present

## 2017-09-26 NOTE — Patient Instructions (Addendum)
.   Favor de ir al [employer], al parecer pueda que necesite espejuelos    IF you received an x-ray today, you will receive an invoice from [facility]. Please contact [facility] at [phone] with questions or concerns regarding your invoice.   IF you received labwork today, you will receive an invoice from [place]. Please contact [facility] at [phone] with questions or concerns regarding your invoice.   Our billing staff will not be able to assist you with questions regarding bills from these companies.  You will be contacted with the lab results as soon as they are available. The fastest way to get your results is to activate your My Chart account. Instructions are located on the last page of this paperwork. If you have not heard from [place] regarding the results in 2 weeks, please contact this office.

## 2017-09-26 NOTE — Progress Notes (Signed)
   5/17/201910:58 AM  Dakota Lin Jan 31, 1971, 46 y.o. male [id number]  Chief Complaint  Patient presents with  . Blurred Vision    HPI:   Patient is a 47 y.o. male who presents today for blurry vision. Reports that he has slowly been noticing things not looking as sharp, specially at night Denies any photophobia, eye pain, eye redness, FB sensation, drainage  Has not seen an eye doctor  Fall Risk  09/26/2017 05/22/2017 01/23/2017 01/16/2017 01/14/2017  Falls in the past year? No No No No No     Depression screen [facility] 2/9 09/26/2017 05/22/2017 01/23/2017  Decreased Interest 0 0 -  Down, Depressed, Hopeless 0 - 0  PHQ - 2 Score 0 0 0    No Known Allergies  Prior to Admission medications   Medication Sig Start Date End Date Taking? Authorizing Provider    History reviewed. No pertinent past medical history.  History reviewed. No pertinent surgical history.  Social History   Tobacco Use  . Smoking status: Never Smoker  . Smokeless tobacco: Never Used  Substance Use Topics  . Alcohol use: Yes    Comment: occ    Family History  Problem Relation Age of Onset  . Stroke Father     Review of Systems  Constitutional: Negative for chills, fever and malaise/fatigue.  Eyes: Positive for blurred vision. Negative for double vision, photophobia, pain, discharge and redness.  Gastrointestinal: Negative for nausea.  Neurological: Negative for headaches.     OBJECTIVE:  Blood pressure 114/80, pulse 73, temperature 98.3 F (36.8 C), temperature source Oral, height 5' 4.57" (1.64 m), weight 179 lb 12.8 oz (81.6 kg), SpO2 97 %.   Visual Acuity Screening   Right eye Left eye Both eyes  Without correction:  With correction:       Physical Exam  Constitutional: He is oriented to person, place, and time. He appears well-developed and well-nourished.  HENT:  Head: Normocephalic and atraumatic.  Mouth/Throat: Oropharynx is clear and moist.  Eyes: Pupils are  equal, round, and reactive to light. Conjunctivae, EOM and lids are normal. Right eye exhibits no discharge. Left eye exhibits no discharge.  Neck: Neck supple.  Cardiovascular: Normal rate and regular rhythm. Exam reveals no gallop and no friction rub.  No murmur heard. Pulmonary/Chest: Effort normal and breath sounds normal. He has no wheezes. He has no rales.  Neurological: He is alert and oriented to person, place, and time.  Skin: Skin is warm and dry.  Psychiatric: He has a normal mood and affect.  Nursing note and vitals reviewed.    ASSESSMENT and PLAN  1. Blurry vision Recommended patient get a formal eye exam  Return if symptoms worsen or fail to improve.    Myles Lipps, MD Primary Care at [facility] [address] [place], [place] [address] Ph.  [phone] Fax [phone]

## 2017-11-19 ENCOUNTER — Emergency Department (HOSPITAL_COMMUNITY): Payer: BLUE CROSS/BLUE SHIELD

## 2017-11-19 ENCOUNTER — Emergency Department (HOSPITAL_COMMUNITY)
Admission: EM | Admit: 2017-11-19 | Discharge: 2017-11-19 | Disposition: A | Discharge status: Home / Self Care | Payer: BLUE CROSS/BLUE SHIELD | Attending: Emergency Medicine | Admitting: Emergency Medicine

## 2017-11-19 ENCOUNTER — Other Ambulatory Visit: Payer: Self-pay

## 2017-11-19 ENCOUNTER — Encounter (HOSPITAL_COMMUNITY): Payer: Self-pay | Admitting: Emergency Medicine

## 2017-11-19 DIAGNOSIS — M5441 Lumbago with sciatica, right side: Secondary | ICD-10-CM | POA: Diagnosis not present

## 2017-11-19 DIAGNOSIS — R52 Pain, unspecified: Secondary | ICD-10-CM

## 2017-11-19 DIAGNOSIS — M545 Low back pain: Secondary | ICD-10-CM | POA: Diagnosis present

## 2017-11-19 DIAGNOSIS — M5442 Lumbago with sciatica, left side: Secondary | ICD-10-CM

## 2017-11-19 MED ORDER — IBUPROFEN 400 MG PO TABS: 400.0000 mg | ORAL_TABLET | Freq: Four times a day (QID) | ORAL | 0 refills | Status: DC | PRN

## 2017-11-19 MED ORDER — IBUPROFEN 400 MG PO TABS
600.0000 mg | ORAL_TABLET | Freq: Once | ORAL | Status: AC
  Administered 2017-11-19: 600 mg via ORAL
  Filled 2017-11-19: qty 1

## 2017-11-19 MED ORDER — METHOCARBAMOL 500 MG PO TABS: 500.0000 mg | ORAL_TABLET | Freq: Two times a day (BID) | ORAL | 0 refills | Status: DC

## 2017-11-19 NOTE — ED Notes (Signed)
Pt updated on POC and new scans ordered.  Pt's family now at bedside

## 2017-11-19 NOTE — ED Notes (Signed)
Pt transported to xray 
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
## 2017-11-19 NOTE — ED Notes (Signed)
Pt understood dc material. NAD noted. [facility]s [facility] at [facility]. Stratus interp used for discharge
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
## 2017-11-19 NOTE — ED Notes (Signed)
Pt. Going to xray  

## 2017-11-19 NOTE — Discharge Instructions (Addendum)
Please follow-up with [place] [facility] regarding your visit today. Please return to the emergency department for any new or worsening symptoms. Please do not drive or operate heavy machinery while taking Robaxin.  SOLICITE ATENCIN MDICA SI: El dolor y otros sntomas empeoran. El medicamento no IT trainerle calma el dolor. El dolor no mejora despus de unas semanas de cuidado Facilities manageren el hogar. Tiene fiebre. SOLICITE ATENCIN MDICA DE INMEDIATO SI: Tiene dolor, adormecimiento o debilidad intensos. Tiene dificultad con el control de la vejiga o los intestinos. SOLICITE AYUDA SI: Siente dolor que no se alivia con reposo o medicamentos. Siente cada vez ms dolor que se extiende a las piernas o los glteos. El dolor no mejora en una semana. Siente dolor por la noche. Pierde peso. Siente escalofros o fiebre. SOLICITE AYUDA DE INMEDIATO SI: No puede controlar su materia fecal (heces) o el pis (orina). Siente debilidad en las piernas o los brazos. Siente prdida de la sensibilidad (adormecimiento) en las piernas o los brazos. Tiene malestar estomacal (nuseas) o vomita. Siente dolor de estmago (abdominal). Siente que se desvanece (se desmaya). SOLICITE AYUDA SI: Los sntomas empeoran. Tiene cualquiera de los siguientes sntomas durante ms de Marsh & McLennandos semanas despus del accidente automovilstico: Dolores de cabeza que perduran (crnicos). Mareos o problemas de equilibrio. Ganas de vomitar (nuseas). Problemas de visin. Ms sensibilidad a los ruidos o a Statisticianla luz. Depresin y cambios en el estado de nimo. Estar preocupado o nervioso (ansioso). Se molesta o se enoja con facilidad. Problemas de memoria. Dificultad para prestar atencin o concentrarse. Problemas para dormir. Cansancio permanente. SOLICITE AYUDA DE INMEDIATO SI: Tiene los siguientes sntomas: Adormecimiento, hormigueo o debilidad en los brazos o las piernas. Dolor intenso en el cuello, especialmente con la palpacin en el medio  de la parte posterior del cuello. Un cambio en la capacidad de controlar la miccin (orina) o la defecacin (heces). Aumento del dolor en cualquier parte del cuerpo. Falta de aire o sensacin de desvanecimiento. Dolor en el pecho. Sangre en la orina, en las heces o en el vmito. Dolor muy intenso en el vientre (abdomen) o en la espalda. Dolores de Turkmenistancabeza muy intensos o que empeoran. Prdida repentina de la visin o visin doble. El ojo se enrojece repentinamente. La parte negra situada en el centro del ojo (pupila) tiene una forma o un tamao extraos. Esta informacin no tiene Theme park managercomo fin reemplazar el consejo del mdico. Asegrese de hacerle al mdico cualquier pregunta que tenga.

## 2018-02-20 ENCOUNTER — Other Ambulatory Visit: Payer: Self-pay

## 2018-02-20 ENCOUNTER — Ambulatory Visit: Payer: BLUE CROSS/BLUE SHIELD | Admitting: Emergency Medicine

## 2018-02-20 ENCOUNTER — Encounter: Payer: Self-pay | Admitting: Emergency Medicine

## 2018-02-20 VITALS — BP 126/78 | HR 74 | Temp 98.0°F | Resp 16 | Wt 180.0 lb

## 2018-02-20 DIAGNOSIS — R030 Elevated blood-pressure reading, without diagnosis of hypertension: Secondary | ICD-10-CM | POA: Diagnosis not present

## 2018-02-20 DIAGNOSIS — R55 Syncope and collapse: Secondary | ICD-10-CM

## 2018-02-20 DIAGNOSIS — Z23 Encounter for immunization: Secondary | ICD-10-CM | POA: Diagnosis not present

## 2018-02-20 NOTE — Progress Notes (Signed)
Dakota Lin 47 y.o.   Chief Complaint  Patient presents with  . Hypertension    pt states his B/p has been elavated    BP Readings from Last 3 Encounters:  02/20/18 126/78  11/19/17 120/88  09/26/17 114/80    HISTORY OF PRESENT ILLNESS: This is Lin 47 y.o. male felt very lightheaded and had Lin near syncopal episode yesterday while he was working in in his homes crawl space.  Went to Lin nearby clinic and was told his blood pressure was Lin little high in the range of 140/90.  Advised to see his PCP.  Today he feels fine.  Asymptomatic.  Has no complaints.  No history of hypertension.  Has never been on medication for it.  HPI   Prior to Admission medications   Medication Sig Start Date End Date Taking? Authorizing Provider  ibuprofen (ADVIL,MOTRIN) 400 MG tablet Take 1 tablet (400 mg total) by mouth every 6 (six) hours as needed. 11/19/17  Yes Dakota Salts Lin, Dakota Lin  methocarbamol (ROBAXIN) 500 MG tablet Take 1 tablet (500 mg total) by mouth 2 (two) times daily. 11/19/17  Yes Dakota Salts Lin, Dakota Lin    No Known Allergies  There are no active problems to display for this patient.   History reviewed. No pertinent past medical history.  History reviewed. No pertinent surgical history.  Social History   Socioeconomic History  . Marital status: Married    Spouse name: Not on file  . Number of children: Not on file  . Years of education: Not on file  . Highest education level: Not on file  Occupational History  . Not on file  Social Needs  . Financial resource strain: Not on file  . Food insecurity:    Worry: Not on file    Inability: Not on file  . Transportation needs:    Medical: Not on file    Non-medical: Not on file  Tobacco Use  . Smoking status: Never Smoker  . Smokeless tobacco: Never Used  Substance and Sexual Activity  . Alcohol use: Yes    Comment: occ  . Drug use: No  . Sexual activity: Yes  Lifestyle  . Physical activity:    Days per week: Not on  file    Minutes per session: Not on file  . Stress: Not on file  Relationships  . Social connections:    Talks on phone: Not on file    Gets together: Not on file    Attends religious service: Not on file    Active member of club or organization: Not on file    Attends meetings of clubs or organizations: Not on file    Relationship status: Not on file  . Intimate partner violence:    Fear of current or ex partner: Not on file    Emotionally abused: Not on file    Physically abused: Not on file    Forced sexual activity: Not on file  Other Topics Concern  . Not on file  Social History Narrative  . Not on file    Family History  Problem Relation Age of Onset  . Stroke Father      Review of Systems  Constitutional: Negative.  Negative for chills and fever.  HENT: Negative.  Negative for sore throat.   Eyes: Negative.  Negative for blurred vision.  Respiratory: Negative.  Negative for cough and shortness of breath.   Cardiovascular: Negative.  Negative for chest pain and palpitations.  Gastrointestinal: Negative for  abdominal pain, diarrhea, nausea and vomiting.  Genitourinary: Negative.   Musculoskeletal: Negative for back pain, myalgias and neck pain.  Skin: Negative.  Negative for rash.  Neurological: Negative.  Negative for dizziness and headaches.  Endo/Heme/Allergies: Negative.   All other systems reviewed and are negative.   Vitals:   02/20/18 1402  BP: 126/78  Pulse: 74  Resp: 16  Temp: 98 F (36.7 C)  SpO2: 97%     Physical Exam  Constitutional: He is oriented to person, place, and time. He appears well-developed and well-nourished.  HENT:  Head: Normocephalic and atraumatic.  Nose: Nose normal.  Mouth/Throat: Oropharynx is clear and moist.  Eyes: Pupils are equal, round, and reactive to light. Conjunctivae and EOM are normal.  Neck: Normal range of motion. Neck supple. No JVD present. No thyromegaly present.  Cardiovascular: Normal rate, regular  rhythm and normal heart sounds.  Pulmonary/Chest: Effort normal and breath sounds normal.  Musculoskeletal: Normal range of motion. He exhibits no edema.  Lymphadenopathy:    He has no cervical adenopathy.  Neurological: He is alert and oriented to person, place, and time. No sensory deficit. He exhibits normal muscle tone. Coordination normal.  Skin: Skin is warm and dry. Capillary refill takes less than 2 seconds.  Vitals reviewed.  Lin total of 25 minutes was spent in the room with the patient, greater than 50% of which was in counseling/coordination of care regarding differential diagnosis, management, prognosis, and need for follow-up if no better or worse.   ASSESSMENT & PLAN: Dakota Lin was seen today for hypertension.  Diagnoses and all orders for this visit:  Near syncope  Need for prophylactic vaccination and inoculation against influenza -     Flu Vaccine QUAD 36+ mos IM  Transient elevated blood pressure    Patient Instructions       If you have lab work done today you will be contacted with your lab results within the next 2 weeks.  If you have not heard from [place] then please contact us. The fastest way to get your results is to register for My Chart.   IF you received an x-ray today, you will receive an invoice from [facility]. Please contact [facility] at [phone] with questions or concerns regarding your invoice.   IF you received labwork today, you will receive an invoice from [place]. Please contact [facility] at [phone] with questions or concerns regarding your invoice.   Our billing staff will not be able to assist you with questions regarding bills from these companies.  You will be contacted with the lab results as soon as they are available. The fastest way to get your results is to activate your My Chart account. Instructions are located on the last page of this paperwork. If you have not heard from [place] regarding the results in 2  weeks, please contact this office.      Presncope (Near-Syncope) El presncope ocurre cuando, sbitamente, se siente dbil, mareado o como si se fuera Lin Artist. Durante un episodio de presncope, usted puede [employer] siguientes sntomas:  Sentirse mareado o como si fuera Lin Barista.  Sentir Programme researcher, broadcasting/film/video (nuseas).  Ver todo blanco o negro.  Tener la piel fra y hmeda. Si se desmay, solicite ayuda inmediata.Comunquese con el servicio de emergencias de su localidad (911en los [place]). No conduzca por sus propios medios [facility]. CUIDADOS EN EL HOGAR Est atento Lin cualquier cambio en los sntomas. Tome estas medidas para controlar la afeccin:  Pdale  Lin alguien que se quede con usted hasta que se sienta estable.  No conduzca vehculos, no use maquinarias ni practique deportes hasta que el mdico lo autorice.  Concurra Lin todas las visitas de control como se lo haya indicado el mdico. Esto es importante.  Si comienza Lin sentir que [employer], recustese de inmediato y levante (eleve) los pies por encima del nivel del corazn. Respire profundamente y de [place] continua. Espere hasta que los sntomas hayan desaparecido.  Beba suficiente lquido para mantener el pis (orina) claro o de color amarillo plido.  Si est tomando un medicamento para la presin arterial o para el corazn, pngase de pie lentamente, tmese algunos minutos para permanecer sentado y luego prese. Esto puede reducir [employer].  Tome los medicamentos de venta libre y los recetados solamente como se lo haya indicado el mdico. SOLICITE AYUDA DE INMEDIATO SI:  Siente un dolor de cabeza muy intenso.  Siente dolor fuera de lo normal en el pecho, el abdomen o la espalda.  Le sangra la boca o el recto.  La materia fecal (heces) es negra o de aspecto alquitranado.  Los latidos son muy rpidos o irregulares (palpitaciones).  Se desmay una o varias veces.  Tiene movimientos  espasmdicos que no puede controlar (convulsiones).  Se siente confundido.  Presenta dificultad para caminar.  Se siente muy dbil.  Tiene problemas de visin. Estos sntomas pueden Customer service manager. No espere hasta que los sntomas desaparezcan. Solicite atencin mdica de inmediato. Comunquese con el servicio de emergencias de su localidad (911en los [place]). No conduzca por sus propios medios [employer]. Esta informacin no tiene Theme park manager el consejo del mdico. Asegrese de hacerle al mdico cualquier pregunta que tenga. Document Released: 07/26/2008 Document Revised: 08/21/2015 Document Reviewed: 01/11/2015 Elsevier Interactive Patient Education  2017 Elsevier Inc.      Edwina Barth, MD [facility]

## 2018-02-20 NOTE — Patient Instructions (Addendum)
If you have lab work done today you will be contacted with your lab results within the next 2 weeks.  If you have not heard from [place] then please contact us. The fastest way to get your results is to register for My Chart.   IF you received an x-ray today, you will receive an invoice from [facility]. Please contact [facility] at [phone] with questions or concerns regarding your invoice.   IF you received labwork today, you will receive an invoice from [place]. Please contact [facility] at [phone] with questions or concerns regarding your invoice.   Our billing staff will not be able to assist you with questions regarding bills from these companies.  You will be contacted with the lab results as soon as they are available. The fastest way to get your results is to activate your My Chart account. Instructions are located on the last page of this paperwork. If you have not heard from [place] regarding the results in 2 weeks, please contact this office.      Presncope (Near-Syncope) El presncope ocurre cuando, sbitamente, se siente dbil, mareado o como si se fuera a Artist. Durante un episodio de presncope, usted puede [employer] siguientes sntomas:  Sentirse mareado o como si fuera a Barista.  Sentir Programme researcher, broadcasting/film/video (nuseas).  Ver todo blanco o negro.  Tener la piel fra y hmeda. Si se desmay, solicite ayuda inmediata.Comunquese con el servicio de emergencias de su localidad (911en los [place]). No conduzca por sus propios medios [facility]. CUIDADOS EN EL HOGAR Est atento a cualquier cambio en los sntomas. Tome estas medidas para controlar la afeccin:  Pdale a alguien que se quede con usted hasta que se sienta estable.  No conduzca vehculos, no use maquinarias ni practique deportes hasta que el mdico lo autorice.  Concurra a todas las visitas de control como se lo haya indicado el mdico. Esto es importante.  Si  comienza a sentir que [employer], recustese de inmediato y levante (eleve) los pies por encima del nivel del corazn. Respire profundamente y de [facility] continua. Espere hasta que los sntomas hayan desaparecido.  Beba suficiente lquido para mantener el pis (orina) claro o de color amarillo plido.  Si est tomando un medicamento para la presin arterial o para el corazn, pngase de pie lentamente, tmese algunos minutos para permanecer sentado y luego prese. Esto puede reducir [employer].  Tome los medicamentos de venta libre y los recetados solamente como se lo haya indicado el mdico. SOLICITE AYUDA DE INMEDIATO SI:  Siente un dolor de cabeza muy intenso.  Siente dolor fuera de lo normal en el pecho, el abdomen o la espalda.  Le sangra la boca o el recto.  La materia fecal (heces) es negra o de aspecto alquitranado.  Los latidos son muy rpidos o irregulares (palpitaciones).  Se desmay una o varias veces.  Tiene movimientos espasmdicos que no puede controlar (convulsiones).  Se siente confundido.  Presenta dificultad para caminar.  Se siente muy dbil.  Tiene problemas de visin. Estos sntomas pueden Customer service manager. No espere hasta que los sntomas desaparezcan. Solicite atencin mdica de inmediato. Comunquese con el servicio de emergencias de su localidad (911en los [place]). No conduzca por sus propios medios [employer]. Esta informacin no tiene Theme park manager el consejo del mdico. Asegrese de hacerle al mdico cualquier pregunta que tenga. Document Released: 07/26/2008 Document Revised: 08/21/2015 Document Reviewed: 01/11/2015 Elsevier Interactive Patient Education  2017  [employer].

## 2018-10-02 ENCOUNTER — Encounter (HOSPITAL_COMMUNITY): Payer: Self-pay | Admitting: Emergency Medicine

## 2018-10-02 ENCOUNTER — Other Ambulatory Visit: Payer: Self-pay

## 2018-10-02 ENCOUNTER — Ambulatory Visit (HOSPITAL_COMMUNITY)
Admission: EM | Admit: 2018-10-02 | Discharge: 2018-10-02 | Disposition: A | Discharge status: Home / Self Care | Payer: BLUE CROSS/BLUE SHIELD | Attending: Internal Medicine | Admitting: Internal Medicine

## 2018-10-02 DIAGNOSIS — R51 Headache: Secondary | ICD-10-CM

## 2018-10-02 DIAGNOSIS — R509 Fever, unspecified: Secondary | ICD-10-CM | POA: Diagnosis not present

## 2018-10-02 DIAGNOSIS — M791 Myalgia, unspecified site: Secondary | ICD-10-CM | POA: Diagnosis not present

## 2018-10-02 DIAGNOSIS — U071 COVID-19: Secondary | ICD-10-CM

## 2018-10-02 DIAGNOSIS — R05 Cough: Secondary | ICD-10-CM

## 2018-10-02 NOTE — ED Notes (Signed)
Patient able to ambulate independently  

## 2018-10-02 NOTE — ED Triage Notes (Signed)
Pt presents to [facility] for assessmnt of 3 days of body aches, fever which is relieved by tylenol but returns a few hours later, wet cough, and fatigue.

## 2018-10-04 NOTE — ED Provider Notes (Signed)
[facility]    CSN: [id number] Arrival date & time: 10/02/18  0849     History   Chief Complaint Chief Complaint  Patient presents with  . Flu-Like Symptoms    HPI Dakota NicolasSantos Lin is a 48 y.o. male no past medical history comes to urgent care with complaints of fever, headaches, body aches, fatigue and nonproductive cough.  Patient symptoms started about 3 days ago and is been persistent.  He comes to urgent care to be evaluated.  Patient's father-in-law and his wife came to this urgent care yesterday and was diagnosed with COVID-19 infection.  Patient lives in the same house with above-mentioned family members.  He denies any nausea vomiting.  He has not tried any over-the-counter medications.   HPI  History reviewed. No pertinent past medical history.  Patient Active Problem List   Diagnosis Date Noted  . Near syncope 02/20/2018    History reviewed. No pertinent surgical history.     Home Medications    Prior to Admission medications   Medication Sig Start Date End Date Taking? Authorizing Provider  ibuprofen (ADVIL,MOTRIN) 400 MG tablet Take 1 tablet (400 mg total) by mouth every 6 (six) hours as needed. 11/19/17   Harlene SaltsMorelli, Brandon A, PA-C  methocarbamol (ROBAXIN) 500 MG tablet Take 1 tablet (500 mg total) by mouth 2 (two) times daily. 11/19/17   Bill SalinasMorelli, Brandon A, PA-C    Family History Family History  Problem Relation Age of Onset  . Stroke Father     Social History Social History   Tobacco Use  . Smoking status: Never Smoker  . Smokeless tobacco: Never Used  Substance Use Topics  . Alcohol use: Yes    Comment: occ  . Drug use: No     Allergies   Patient has no known allergies.   Review of Systems Review of Systems  Constitutional: Positive for activity change, appetite change, chills, fatigue and fever.  HENT: Positive for congestion and sore throat. Negative for facial swelling, hearing loss, mouth sores, nosebleeds, postnasal drip,  rhinorrhea, sinus pressure and sinus pain.   Eyes: Negative.   Respiratory: Positive for cough. Negative for shortness of breath.   Gastrointestinal: Negative for abdominal distention, abdominal pain, nausea and vomiting.  Genitourinary: Negative for dysuria, frequency and urgency.  Musculoskeletal: Positive for arthralgias and myalgias.  Neurological: Positive for headaches. Negative for dizziness and syncope.     Physical Exam Triage Vital Signs ED Triage Vitals  Enc Vitals Group     BP 10/02/18 0919 (!) 128/96     Pulse Rate 10/02/18 0919 92     Resp 10/02/18 0919 18     Temp 10/02/18 0919 98.7 F (37.1 C)     Temp Source 10/02/18 0919 Oral     SpO2 10/02/18 0919 99 %     Weight --      Height --      Head Circumference --      Peak Flow --      Pain Score 10/02/18 0921 9     Pain Loc --      Pain Edu? --      Excl. in GC? --    No data found.  Updated Vital Signs BP (!) 128/96 (BP Location: Left Arm)   Pulse 92   Temp 98.7 F (37.1 C) (Oral) Comment: 1.5 hr ago took Tylenol  Resp 18   SpO2 99%   Visual Acuity Right Eye Distance:   Left Eye Distance:   Bilateral  Distance:    Right Eye Near:   Left Eye Near:    Bilateral Near:     Physical Exam Constitutional:      General: He is in acute distress.     Appearance: He is ill-appearing. He is not toxic-appearing.  Eyes:     Conjunctiva/sclera: Conjunctivae normal.  Neck:     Musculoskeletal: No neck rigidity or muscular tenderness.  Cardiovascular:     Rate and Rhythm: Normal rate and regular rhythm.     Pulses: Normal pulses.     Heart sounds: Normal heart sounds.  Pulmonary:     Effort: Pulmonary effort is normal. No respiratory distress.     Breath sounds: Normal breath sounds. No stridor. No rhonchi.  Abdominal:     General: Bowel sounds are normal. There is no distension.     Palpations: Abdomen is soft.     Tenderness: There is no abdominal tenderness. There is no guarding.  Musculoskeletal:  Normal range of motion.        General: No swelling or tenderness.  Skin:    General: Skin is warm.     Capillary Refill: Capillary refill takes less than 2 seconds.  Neurological:     Mental Status: He is alert.      UC Treatments / Results  Labs (all labs ordered are listed, but only abnormal results are displayed) Labs Reviewed - No data to display  EKG None  Radiology No results found.  Procedures Procedures (including critical care time)  Medications Ordered in UC Medications - No data to display  Initial Impression / Assessment and Plan / UC Course  I have reviewed the triage vital signs and the nursing notes.  Pertinent labs & imaging results that were available during my care of the patient were reviewed by me and considered in my medical decision making (see chart for details).     1.  COVID-19 infection: COVID-19 patient discharge instructions given Patient is encouraged to self quarantine Tylenol/Motrin for fever, body aches or headaches Virtual visits if patient has any further questions or symptom changes If patient develops severe respiratory distress, altered mentation or intractable nausea/vomiting he will need to reach out to urgent care or ED for evaluation. Final Clinical Impressions(s) / UC Diagnoses   Final diagnoses:  COVID-19 virus infection   Discharge Instructions   None    ED Prescriptions    None     Controlled Substance Prescriptions [place] Controlled Substance Registry consulted? No   Merrilee Jansky, MD 10/04/18 [phone]

## 2018-10-19 ENCOUNTER — Other Ambulatory Visit: Payer: Self-pay

## 2018-10-19 ENCOUNTER — Encounter (HOSPITAL_COMMUNITY): Payer: Self-pay | Admitting: Emergency Medicine

## 2018-10-19 ENCOUNTER — Ambulatory Visit (HOSPITAL_COMMUNITY)
Admission: EM | Admit: 2018-10-19 | Discharge: 2018-10-19 | Disposition: A | Discharge status: Home / Self Care | Payer: BLUE CROSS/BLUE SHIELD | Attending: Family Medicine | Admitting: Family Medicine

## 2018-10-19 DIAGNOSIS — R21 Rash and other nonspecific skin eruption: Secondary | ICD-10-CM | POA: Diagnosis not present

## 2018-10-19 MED ORDER — TRIAMCINOLONE ACETONIDE 0.1 % EX CREA: 1.0000 "application " | TOPICAL_CREAM | Freq: Two times a day (BID) | CUTANEOUS | 0 refills | Status: DC

## 2018-10-19 NOTE — ED Triage Notes (Signed)
Seen 5/22 for body aches-wife and father-in-law positive for covid at the time.  Patient is here for a rash  Patient points to areas on back of fingers, particularly joints and knees as being areas of rash and itching. 2 day history of this.  Says he has had similar issue a few years ago when he had a similar episode.

## 2018-10-19 NOTE — Discharge Instructions (Addendum)
Apply Eucerin or Calamine lotion to the affected areas as needed.  Kenalog twice a day.  If symptoms worsen or do not improve in the next week to return to be seen or to follow up with your PCP.

## 2018-10-19 NOTE — ED Provider Notes (Signed)
[facility]    CSN: [id number] Arrival date & time: 10/19/18  1418     History   Chief Complaint Chief Complaint  Patient presents with  . Rash    HPI Dakota Lin is a 48 y.o. male.   Dakota Lin presents with complaints of itching rash to bilateral knees and hands. Noted it for the past few days. He had been working out in his yard, endorses possible poison ivy exposure. Has been applying lotion which hasn't helped. Has had once before in the past. No pain. No fevers. No lesions. Recently treated for presumptive Covid-19. Without contributing medical history.       ROS per HPI, negative if not otherwise mentioned.      History reviewed. No pertinent past medical history.  Patient Active Problem List   Diagnosis Date Noted  . Near syncope 02/20/2018    History reviewed. No pertinent surgical history.     Home Medications    Prior to Admission medications   Medication Sig Start Date End Date Taking? Authorizing Provider  triamcinolone cream (KENALOG) 0.1 % Apply 1 application topically 2 (two) times daily. 10/19/18   Georgetta HaberBurky, Ryker Pherigo B, NP    Family History Family History  Problem Relation Age of Onset  . Stroke Father     Social History Social History   Tobacco Use  . Smoking status: Never Smoker  . Smokeless tobacco: Never Used  Substance Use Topics  . Alcohol use: Yes    Comment: occ  . Drug use: No     Allergies   Patient has no known allergies.   Review of Systems Review of Systems   Physical Exam Triage Vital Signs ED Triage Vitals  Enc Vitals Group     BP 10/19/18 1537 127/82     Pulse Rate 10/19/18 1537 70     Resp 10/19/18 1537 18     Temp 10/19/18 1537 97.9 F (36.6 C)     Temp Source 10/19/18 1537 Oral     SpO2 10/19/18 1537 96 %     Weight --      Height --      Head Circumference --      Peak Flow --      Pain Score 10/19/18 1540 0     Pain Loc --      Pain Edu? --      Excl. in GC? --    No data  found.  Updated Vital Signs BP 127/82 (BP Location: Left Arm)   Pulse 70   Temp 97.9 F (36.6 C) (Oral)   Resp 18   SpO2 96%    Physical Exam Constitutional:      Appearance: He is well-developed.  Cardiovascular:     Rate and Rhythm: Normal rate.  Pulmonary:     Effort: Pulmonary effort is normal.  Skin:    General: Skin is warm and dry.          Comments: Dry skin patches to bilateral knees, ashen with surrounding redness; a few scattered skin toned papules to dorsal aspect of finger webs  Neurological:     Mental Status: He is alert and oriented to person, place, and time.      UC Treatments / Results  Labs (all labs ordered are listed, but only abnormal results are displayed) Labs Reviewed - No data to display  EKG None  Radiology No results found.  Procedures Procedures (including critical care time)  Medications Ordered in UC Medications -  No data to display  Initial Impression / Assessment and Plan / UC Course  I have reviewed the triage vital signs and the nursing notes.  Pertinent labs & imaging results that were available during my care of the patient were reviewed by me and considered in my medical decision making (see chart for details).     Dry skin vs dermatitis. Moisturizing agents as well as kenalog provided. If symptoms worsen or do not improve in the next week to return to be seen or to follow up with PCP. Patient verbalized understanding and agreeable to plan.   Final Clinical Impressions(s) / UC Diagnoses   Final diagnoses:  Rash     Discharge Instructions     Apply Eucerin or Calamine lotion to the affected areas as needed.  Kenalog twice a day.  If symptoms worsen or do not improve in the next week to return to be seen or to follow up with your PCP.     ED Prescriptions    Medication Sig Dispense Auth. Provider   triamcinolone cream (KENALOG) 0.1 % Apply 1 application topically 2 (two) times daily. 30 g Zigmund Gottron, NP      Controlled Substance Prescriptions  Controlled Substance Registry consulted? Not Applicable   Zigmund Gottron, NP 10/19/18 2318
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
## 2020-05-23 ENCOUNTER — Other Ambulatory Visit: Payer: Self-pay

## 2020-05-23 DIAGNOSIS — Z20822 Contact with and (suspected) exposure to covid-19: Secondary | ICD-10-CM

## 2020-05-25 LAB — NOVEL CORONAVIRUS, NAA: SARS-CoV-2, NAA: NOT DETECTED

## 2020-05-25 LAB — SARS-COV-2, NAA 2 DAY TAT

## 2020-06-22 ENCOUNTER — Other Ambulatory Visit: Payer: Self-pay

## 2020-06-22 ENCOUNTER — Encounter (HOSPITAL_COMMUNITY): Payer: Self-pay | Admitting: Emergency Medicine

## 2020-06-22 ENCOUNTER — Emergency Department (HOSPITAL_COMMUNITY)
Admission: EM | Admit: 2020-06-22 | Discharge: 2020-06-22 | Disposition: A | Discharge status: Home / Self Care | Payer: 59 | Attending: Emergency Medicine | Admitting: Emergency Medicine

## 2020-06-22 ENCOUNTER — Emergency Department (HOSPITAL_COMMUNITY): Payer: 59

## 2020-06-22 DIAGNOSIS — Z23 Encounter for immunization: Secondary | ICD-10-CM | POA: Diagnosis not present

## 2020-06-22 DIAGNOSIS — S62635B Displaced fracture of distal phalanx of left ring finger, initial encounter for open fracture: Secondary | ICD-10-CM | POA: Insufficient documentation

## 2020-06-22 DIAGNOSIS — W208XXA Other cause of strike by thrown, projected or falling object, initial encounter: Secondary | ICD-10-CM | POA: Diagnosis not present

## 2020-06-22 DIAGNOSIS — S61317A Laceration without foreign body of left little finger with damage to nail, initial encounter: Secondary | ICD-10-CM | POA: Insufficient documentation

## 2020-06-22 DIAGNOSIS — S6992XA Unspecified injury of left wrist, hand and finger(s), initial encounter: Secondary | ICD-10-CM | POA: Diagnosis present

## 2020-06-22 SURGERY — IRRIGATION AND DEBRIDEMENT EXTREMITY
Anesthesia: Choice | Site: Finger | Laterality: Left

## 2020-06-22 MED ORDER — OXYCODONE-ACETAMINOPHEN 5-325 MG PO TABS: 1.0000 | ORAL_TABLET | Freq: Four times a day (QID) | ORAL | 0 refills | Status: DC | PRN

## 2020-06-22 MED ORDER — MORPHINE SULFATE (PF) 2 MG/ML IV SOLN
4.0000 mg | Freq: Once | INTRAVENOUS | Status: AC
  Administered 2020-06-22: 4 mg via INTRAVENOUS
  Filled 2020-06-22: qty 2

## 2020-06-22 MED ORDER — OXYCODONE-ACETAMINOPHEN 5-325 MG PO TABS
1.0000 | ORAL_TABLET | ORAL | Status: DC | PRN
  Administered 2020-06-22: 1 via ORAL
  Filled 2020-06-22: qty 1

## 2020-06-22 MED ORDER — CEFAZOLIN SODIUM 1 G IJ SOLR: 1.0000 g | Freq: Once | INTRAMUSCULAR | Status: DC

## 2020-06-22 MED ORDER — ONDANSETRON HCL 4 MG/2ML IJ SOLN
4.0000 mg | Freq: Once | INTRAMUSCULAR | Status: AC
  Administered 2020-06-22: 4 mg via INTRAVENOUS
  Filled 2020-06-22: qty 2

## 2020-06-22 MED ORDER — CEFAZOLIN SODIUM-DEXTROSE 2-4 GM/100ML-% IV SOLN
2.0000 g | Freq: Once | INTRAVENOUS | Status: AC
  Administered 2020-06-22: 2 g via INTRAVENOUS
  Filled 2020-06-22: qty 100

## 2020-06-22 MED ORDER — SODIUM CHLORIDE 0.9 % IV SOLN: 8.0000 mg | Freq: Once | INTRAVENOUS | Status: DC

## 2020-06-22 MED ORDER — LIDOCAINE HCL 2 % IJ SOLN
20.0000 mL | Freq: Once | INTRAMUSCULAR | Status: AC
  Administered 2020-06-22: 400 mg via INTRADERMAL
  Filled 2020-06-22: qty 20

## 2020-06-22 MED ORDER — CEPHALEXIN 500 MG PO CAPS: 500.0000 mg | ORAL_CAPSULE | Freq: Four times a day (QID) | ORAL | 0 refills | Status: DC

## 2020-06-22 MED ORDER — TETANUS-DIPHTH-ACELL PERTUSSIS 5-2.5-18.5 LF-MCG/0.5 IM SUSY
0.5000 mL | PREFILLED_SYRINGE | Freq: Once | INTRAMUSCULAR | Status: AC
Start: 2020-06-22 — End: 2020-06-22
  Administered 2020-06-22: 0.5 mL via INTRAMUSCULAR
  Filled 2020-06-22: qty 0.5

## 2020-06-22 MED ORDER — OXYCODONE-ACETAMINOPHEN 5-325 MG PO TABS: 1.0000 | ORAL_TABLET | Freq: Once | ORAL | Status: DC

## 2020-06-22 NOTE — ED Provider Notes (Signed)
Dakota Lin   CSN: [id number] Arrival date & time: 06/22/20  1105     History Chief Complaint  Patient presents with  . Finger Injury    Dakota Lin is a 50 y.o. male.  The history is provided by the patient. No language interpreter was used.     50 year old Hispanic male presenting for evaluation of hand injury.  Patient report earlier today he was working on a tractor when a heavy object fell and struck his left dominant hand.  Injury is to the distal tip of his fingers.  Report acute onset of sharp throbbing 8 out of 10 nonradiating pain to the affected area.  Pain did improve with Percocet that was given here.  He is unsure of his last tetanus shot.  He report tingling sensation to the tip of finger.  He denies any other injury.  History reviewed. No pertinent past medical history.  Patient Active Problem List   Diagnosis Date Noted  . Near syncope 02/20/2018    History reviewed. No pertinent surgical history.     Family History  Problem Relation Age of Onset  . Stroke Father     Social History   Tobacco Use  . Smoking status: Never Smoker  . Smokeless tobacco: Never Used  Vaping Use  . Vaping Use: Never used  Substance Use Topics  . Alcohol use: Yes    Comment: occ  . Drug use: No    Home Medications Prior to Admission medications   Medication Sig Start Date End Date Taking? Authorizing Provider  triamcinolone cream (KENALOG) 0.1 % Apply 1 application topically 2 (two) times daily. 10/19/18   Georgetta Haber, NP    Allergies    Patient has no known allergies.  Review of Systems   Review of Systems  Constitutional: Negative for fever.  Skin: Positive for wound.  Neurological: Positive for numbness.    Physical Exam Updated Vital Signs BP (!) 147/110 (BP Location: Right Arm)   Pulse 78   Temp 99.1 F (37.3 C)   Resp 18   SpO2 95%   Physical Exam Vitals and nursing Lin reviewed.   Constitutional:      General: He is not in acute distress.    Appearance: He is well-developed and well-nourished.  HENT:     Head: Atraumatic.  Eyes:     Conjunctiva/sclera: Conjunctivae normal.  Musculoskeletal:        General: Signs of injury (Left hand: There is a 3 cm deep laceration noted to the distal tip of the fourth digit involving the nail and extending towards the DIP.  Complete subungual hematoma involving the nailbed.  sensation intact on palpation.) present.     Cervical back: Neck supple.     Comments: Left hand: Fifth digit: 1 cm laceration noted to the distal tip of the finger with nail avulsion and subungual hematoma noted.  No joint involvement.  Skin:    Findings: No rash.  Neurological:     Mental Status: He is alert.  Psychiatric:        Mood and Affect: Mood and affect normal.     ED Results / Procedures / Treatments   Labs (all labs ordered are listed, but only abnormal results are displayed) Labs Reviewed - No data to display  EKG None  Radiology DG Hand Complete Left  Result Date: 06/22/2020 CLINICAL DATA:  Blunt trauma to the fourth and fifth digits, initial encounter. EXAM: LEFT HAND -  COMPLETE 3+ VIEW COMPARISON:  None. FINDINGS: Transverse fracture is noted through the fourth distal phalanx with mild displacement medially. Mild soft tissue swelling is noted in the fourth and fifth digits. No other fracture is seen. No foreign body is noted. IMPRESSION: Fourth distal phalangeal fracture. Soft tissue swelling in the fourth and fifth digits is noted. Electronically Signed   By: Alcide Clever M.D.   On: 06/22/2020 12:13    Procedures .Marland KitchenLaceration Repair  Date/Time: 06/22/2020 3:20 PM Performed by: Fayrene Helper, PA-C Authorized by: Fayrene Helper, PA-C   Consent:    Consent obtained:  Verbal   Consent given by:  Patient   Risks discussed:  Infection, need for additional repair, pain, poor cosmetic result and poor wound healing   Alternatives  discussed:  No treatment and delayed treatment Universal protocol:    Procedure explained and questions answered to patient or proxy's satisfaction: yes     Relevant documents present and verified: yes     Test results available: yes     Imaging studies available: yes     Required blood products, implants, devices, and special equipment available: yes     Site/side marked: yes     Immediately prior to procedure, a time out was called: yes     Patient identity confirmed:  Verbally with patient Anesthesia:    Anesthesia method:  Local infiltration   Local anesthetic:  Lidocaine 2% w/o epi Laceration details:    Location:  Finger   Finger location:  L ring finger   Length (cm):  4   Depth (mm):  6 Pre-procedure details:    Preparation:  Patient was prepped and draped in usual sterile fashion and imaging obtained to evaluate for foreign bodies Exploration:    Limited defect created (wound extended): yes     Hemostasis achieved with:  Tourniquet   Imaging obtained: x-ray     Imaging outcome: foreign body not noted     Wound exploration: wound explored through full range of motion     Wound extent: underlying fracture and vascular damage     Wound extent: no nerve damage noted     Contaminated: no   Treatment:    Area cleansed with:  Povidone-iodine and saline   Amount of cleaning:  Standard   Irrigation solution:  Sterile saline   Irrigation method:  Pressure wash   Visualized foreign bodies/material removed: no     Debridement:  Minimal   Undermining:  Minimal Skin repair:    Repair method:  Sutures   Suture size:  5-0   Suture material:  Prolene   Suture technique:  Simple interrupted   Number of sutures:  8 Approximation:    Approximation:  Close Repair type:    Repair type:  Complex Post-procedure details:    Dressing:  Bulky dressing   Procedure completion:  Tolerated well, no immediate complications .Marland KitchenLaceration Repair  Date/Time: 06/22/2020 3:22 PM Performed by:  Fayrene Helper, PA-C Authorized by: Fayrene Helper, PA-C   Consent:    Consent obtained:  Verbal   Consent given by:  Patient   Risks discussed:  Infection, need for additional repair, pain, poor cosmetic result and poor wound healing   Alternatives discussed:  No treatment and delayed treatment Universal protocol:    Procedure explained and questions answered to patient or proxy's satisfaction: yes     Relevant documents present and verified: yes     Test results available: yes     Imaging studies available: yes  Required blood products, implants, devices, and special equipment available: yes     Site/side marked: yes     Immediately prior to procedure, a time out was called: yes     Patient identity confirmed:  Verbally with patient Anesthesia:    Anesthesia method:  Local infiltration   Local anesthetic:  Lidocaine 2% w/o epi Laceration details:    Location:  Finger   Finger location:  L small finger   Length (cm):  1   Depth (mm):  3 Pre-procedure details:    Preparation:  Patient was prepped and draped in usual sterile fashion and imaging obtained to evaluate for foreign bodies Exploration:    Limited defect created (wound extended): no     Imaging obtained: x-ray     Imaging outcome: foreign body not noted     Wound exploration: wound explored through full range of motion     Wound extent: no underlying fracture noted     Contaminated: no   Treatment:    Area cleansed with:  Povidone-iodine and saline   Amount of cleaning:  Standard   Irrigation solution:  Sterile saline   Debridement:  None   Undermining:  None   Scar revision: no   Skin repair:    Repair method:  Sutures   Suture size:  5-0   Suture material:  Prolene   Suture technique:  Simple interrupted   Number of sutures:  1 Approximation:    Approximation:  Close Repair type:    Repair type:  Simple Post-procedure details:    Procedure completion:  Tolerated well, no immediate complications              Medications Ordered in ED Medications  Tdap (BOOSTRIX) injection 0.5 mL (0.5 mLs Intramuscular Given 06/22/20 1511)  lidocaine (XYLOCAINE) 2 % (with pres) injection 400 mg (400 mg Intradermal Given by Other 06/22/20 1514)  morphine 2 MG/ML injection 4 mg (4 mg Intravenous Given 06/22/20 1508)  ceFAZolin (ANCEF) IVPB 2g/100 mL premix (0 g Intravenous Stopped 06/22/20 1552)  ondansetron (ZOFRAN) injection 4 mg (4 mg Intravenous Given 06/22/20 1507)    ED Course  I have reviewed the triage vital signs and the nursing notes.  Pertinent labs & imaging results that were available during my care of the patient were reviewed by me and considered in my medical decision making (see chart for details).    MDM Rules/Calculators/A&P                          BP (!) 126/99 (BP Location: Right Arm)   Pulse 71   Temp 98.3 F (36.8 C) (Oral)   Resp 16   SpO2 97%   Final Clinical Impression(s) / ED Diagnoses Final diagnoses:  Displaced fracture of distal phalanx of left ring finger, initial encounter for open fracture  Laceration of left little finger w/o foreign body with damage to nail, initial encounter    Rx / DC Orders ED Discharge Orders         Ordered    cephALEXin (KEFLEX) 500 MG capsule  4 times daily        06/22/20 1629    oxyCODONE-acetaminophen (PERCOCET) 5-325 MG tablet  Every 6 hours PRN        06/22/20 1629         1:40 PM Patient suffered injury to his left dominant hand.  This is a crush injury causing laceration to the distal tip of the  fourth and fifth finger with nail involvement.  Please refer to picture for better visualization.  He is not up-to-date with tetanus, will update.  Appreciate consultation from orthopedic specialist Charma Igo, PA-C who reviewed the images and felt patient would need to have surgical management of his wound.  We will give Ancef 2 g, keep patient n.p.o., provide additional pain medication, and keep wound covered.  Rapid  Covid test ordered.  X-ray of the left hand demonstrate fourth distal phalangeal fracture.   3:23 PM Orthopedic specialist has seen evaluate patient.  Initial plan was to take patient to the OR for surgical repair of his laceration.  However, they have requested me to washout the wound and provide closure and patient can follow-up outpatient instead.  After irrigating cleans wound with povidone iodine as well as normal saline.  Laceration was repaired by me.  Will place finger in finger splint for protection, antibiotic was given.  Patient will be going home with antibiotic, opiate pain medication, and close follow-up with specialist for further care.   Fayrene Helper, PA-C 06/22/20 1631    Terrilee Files, MD 06/22/20 [phone]

## 2020-06-22 NOTE — ED Triage Notes (Signed)
Patient her with left ring finger injury, patient states he was working with a tractor when a heavy object fell and deeply lacerated the distal portion of the finger. Hemorrhage controlled in triage. Unsure of last tetanus shot.

## 2020-06-22 NOTE — Discharge Instructions (Addendum)
Por favor, siga de cerca con el especialista en manos Dr. Lynden Oxford para una evaluacin ms detallada de su lesin en los dedos. tome antibiticos y medicamentos para el dolor segn lo prescrito.

## 2020-06-22 NOTE — Consult Note (Addendum)
Reason for Consult:Left hand injury Referring Physician: Gardner Candle Time called: 1334 Time at bedside: 1341   Dakota Lin is an 50 y.o. male.  HPI: Herbie was working at his house with a tractor when a sharp attachment came down onto his left hand and injured the ring and little fingers. He came to the ED for evaluation and x-rays showed a distal phalanx fx of the left hand that was open as well as injury to the little finger and hand surgery was consulted. He is RHD and currently unemployed.  History reviewed. No pertinent past medical history.  History reviewed. No pertinent surgical history.  Family History  Problem Relation Age of Onset  . Stroke Father     Social History:  reports that he has never smoked. He has never used smokeless tobacco. He reports current alcohol use. He reports that he does not use drugs.  Allergies: No Known Allergies  Medications: I have reviewed the patient's current medications.  No results found for this or any previous visit (from the past 48 hour(s)).  DG Hand Complete Left  Result Date: 06/22/2020 CLINICAL DATA:  Blunt trauma to the fourth and fifth digits, initial encounter. EXAM: LEFT HAND - COMPLETE 3+ VIEW COMPARISON:  None. FINDINGS: Transverse fracture is noted through the fourth distal phalanx with mild displacement medially. Mild soft tissue swelling is noted in the fourth and fifth digits. No other fracture is seen. No foreign body is noted. IMPRESSION: Fourth distal phalangeal fracture. Soft tissue swelling in the fourth and fifth digits is noted. Electronically Signed   By: Alcide Clever M.D.   On: 06/22/2020 12:13    Review of Systems  HENT: Negative for ear discharge, ear pain, hearing loss and tinnitus.   Eyes: Negative for photophobia and pain.  Respiratory: Negative for cough and shortness of breath.   Cardiovascular: Negative for chest pain.  Gastrointestinal: Negative for abdominal pain, nausea and vomiting.  Genitourinary:  Negative for dysuria, flank pain, frequency and urgency.  Musculoskeletal: Positive for arthralgias (Left hand). Negative for back pain, myalgias and neck pain.  Neurological: Negative for dizziness and headaches.  Hematological: Does not bruise/bleed easily.  Psychiatric/Behavioral: The patient is not nervous/anxious.    Blood pressure (!) 147/110, pulse 78, temperature 99.1 F (37.3 C), resp. rate 18, SpO2 95 %. Physical Exam Constitutional:      General: He is not in acute distress.    Appearance: He is well-developed and well-nourished. He is not diaphoretic.  HENT:     Head: Normocephalic and atraumatic.  Eyes:     General: No scleral icterus.       Right eye: No discharge.        Left eye: No discharge.     Conjunctiva/sclera: Conjunctivae normal.  Cardiovascular:     Rate and Rhythm: Normal rate and regular rhythm.  Pulmonary:     Effort: Pulmonary effort is normal. No respiratory distress.  Musculoskeletal:     Cervical back: Normal range of motion.     Comments: Left shoulder, elbow, wrist, digits- Laceration of P3 ring finger from nail bed radially to ventral DIP joint, laceration nail bed of little finger, severe TTP, ring finger DIP flexion 4/5, sensation intact both fingers radial/ulnar, strength DIP little finger 5/5, no instability, no blocks to motion  Sens  Ax/R/M/U intact  Mot   Ax/ R/ PIN/ M/ AIN/ U intact  Rad 2+  Skin:    General: Skin is warm and dry.  Neurological:  Mental Status: He is alert.  Psychiatric:        Mood and Affect: Mood and affect normal.        Behavior: Behavior normal.     Assessment/Plan: Left hand injury -- EDPA to I&D and close. Dr. Roney Mans to see in office tomorrow.    Freeman Caldron, PA-C Orthopedic Surgery [phone] 06/22/2020, 1:57 PM

## 2020-06-23 ENCOUNTER — Inpatient Hospital Stay: Admit: 2020-06-23 | Payer: 59 | Admitting: Orthopaedic Surgery

## 2020-06-28 ENCOUNTER — Other Ambulatory Visit (HOSPITAL_COMMUNITY)
Admission: RE | Admit: 2020-06-28 | Discharge: 2020-06-28 | Disposition: A | Discharge status: Home / Self Care | Payer: 59 | Source: Ambulatory Visit | Attending: Orthopaedic Surgery | Admitting: Orthopaedic Surgery

## 2020-06-28 ENCOUNTER — Encounter (HOSPITAL_COMMUNITY): Payer: Self-pay | Admitting: Orthopaedic Surgery

## 2020-06-28 DIAGNOSIS — Z20822 Contact with and (suspected) exposure to covid-19: Secondary | ICD-10-CM | POA: Insufficient documentation

## 2020-06-28 DIAGNOSIS — Z01812 Encounter for preprocedural laboratory examination: Secondary | ICD-10-CM | POA: Diagnosis present

## 2020-06-28 LAB — SARS CORONAVIRUS 2 (TAT 6-24 HRS): SARS Coronavirus 2: NEGATIVE

## 2020-06-28 NOTE — Progress Notes (Signed)
Patient is Spanish speaking, used [employer], #[id number] for PAT information.  PCP - [facility] [place] - Dr Edward Jolly Cardiologist - n/a  Chest x-ray - n/a EKG - n/a Stress Test - n/a ECHO - n/a Cardiac Cath - n/a  Aspirin Instructions: Follow your surgeon's instructions on when to stop aspirin prior to surgery,  If no instructions were given by your surgeon then you will need to call the office for those instructions.  STOP now taking any Aspirin (unless otherwise instructed by your surgeon), Aleve, Naproxen, Ibuprofen, Motrin, Advil, Goody's, BC's, all herbal medications, fish oil, and all vitamins.   Coronavirus Screening Covid test is scheduled on  Do you have any of the following symptoms:  Cough yes/no: No Fever (>100.4F)  yes/no: No Runny nose yes/no: No Sore throat yes/no: No Difficulty breathing/shortness of breath  yes/no: No  Have you traveled in the last 14 days and where? yes/no: No  Patient verbalized understanding of instructions that were given via phone.

## 2020-06-29 ENCOUNTER — Encounter (HOSPITAL_COMMUNITY): Payer: Self-pay | Admitting: Orthopaedic Surgery

## 2020-06-29 ENCOUNTER — Ambulatory Visit (HOSPITAL_COMMUNITY): Payer: 59 | Admitting: Certified Registered Nurse Anesthetist

## 2020-06-29 ENCOUNTER — Ambulatory Visit (HOSPITAL_COMMUNITY)
Admission: RE | Admit: 2020-06-29 | Discharge: 2020-06-29 | Disposition: A | Discharge status: Home / Self Care | Payer: 59 | Attending: Orthopaedic Surgery | Admitting: Orthopaedic Surgery

## 2020-06-29 ENCOUNTER — Ambulatory Visit (HOSPITAL_COMMUNITY): Payer: 59

## 2020-06-29 ENCOUNTER — Encounter (HOSPITAL_COMMUNITY)
Admission: RE | Disposition: A | Discharge status: Home / Self Care | Payer: Self-pay | Source: Home / Self Care | Attending: Orthopaedic Surgery

## 2020-06-29 ENCOUNTER — Other Ambulatory Visit: Payer: Self-pay

## 2020-06-29 DIAGNOSIS — Z7982 Long term (current) use of aspirin: Secondary | ICD-10-CM | POA: Diagnosis not present

## 2020-06-29 DIAGNOSIS — Z79899 Other long term (current) drug therapy: Secondary | ICD-10-CM | POA: Diagnosis not present

## 2020-06-29 DIAGNOSIS — S61317A Laceration without foreign body of left little finger with damage to nail, initial encounter: Secondary | ICD-10-CM | POA: Insufficient documentation

## 2020-06-29 DIAGNOSIS — Z823 Family history of stroke: Secondary | ICD-10-CM | POA: Diagnosis not present

## 2020-06-29 DIAGNOSIS — S62635B Displaced fracture of distal phalanx of left ring finger, initial encounter for open fracture: Secondary | ICD-10-CM | POA: Insufficient documentation

## 2020-06-29 DIAGNOSIS — S6722XA Crushing injury of left hand, initial encounter: Secondary | ICD-10-CM | POA: Diagnosis not present

## 2020-06-29 DIAGNOSIS — W230XXA Caught, crushed, jammed, or pinched between moving objects, initial encounter: Secondary | ICD-10-CM | POA: Insufficient documentation

## 2020-06-29 HISTORY — PX: INCISION AND DRAINAGE OF WOUND: SHX1803

## 2020-06-29 HISTORY — DX: Hyperlipidemia, unspecified: E78.5

## 2020-06-29 LAB — SURGICAL PCR SCREEN
MRSA, PCR: NEGATIVE
Staphylococcus aureus: NEGATIVE

## 2020-06-29 SURGERY — IRRIGATION AND DEBRIDEMENT WOUND
Anesthesia: Monitor Anesthesia Care | Site: Finger | Laterality: Left

## 2020-06-29 MED ORDER — ORAL CARE MOUTH RINSE: 15.0000 mL | Freq: Once | OROMUCOSAL | Status: DC

## 2020-06-29 MED ORDER — PROPOFOL 1000 MG/100ML IV EMUL
INTRAVENOUS | Status: AC
  Filled 2020-06-29: qty 100

## 2020-06-29 MED ORDER — HYDROCODONE-ACETAMINOPHEN 5-325 MG PO TABS: 1.0000 | ORAL_TABLET | Freq: Four times a day (QID) | ORAL | 0 refills | Status: DC | PRN

## 2020-06-29 MED ORDER — BUPIVACAINE HCL (PF) 0.5 % IJ SOLN
INTRAMUSCULAR | Status: DC | PRN
  Administered 2020-06-29: 5 mL

## 2020-06-29 MED ORDER — FENTANYL CITRATE (PF) 250 MCG/5ML IJ SOLN
INTRAMUSCULAR | Status: DC | PRN
  Administered 2020-06-29: 25 ug via INTRAVENOUS
  Administered 2020-06-29: 50 ug via INTRAVENOUS
  Administered 2020-06-29: 75 ug via INTRAVENOUS

## 2020-06-29 MED ORDER — CEFAZOLIN SODIUM-DEXTROSE 2-4 GM/100ML-% IV SOLN
INTRAVENOUS | Status: AC
  Filled 2020-06-29: qty 100

## 2020-06-29 MED ORDER — GLYCOPYRROLATE PF 0.2 MG/ML IJ SOSY
PREFILLED_SYRINGE | INTRAMUSCULAR | Status: AC
  Filled 2020-06-29: qty 1

## 2020-06-29 MED ORDER — CHLORHEXIDINE GLUCONATE 0.12 % MT SOLN: 15.0000 mL | Freq: Once | OROMUCOSAL | Status: DC

## 2020-06-29 MED ORDER — ONDANSETRON HCL 4 MG/2ML IJ SOLN
INTRAMUSCULAR | Status: DC | PRN
  Administered 2020-06-29: 4 mg via INTRAVENOUS

## 2020-06-29 MED ORDER — CHLORHEXIDINE GLUCONATE 4 % EX LIQD: 60.0000 mL | Freq: Once | CUTANEOUS | Status: DC

## 2020-06-29 MED ORDER — OXYCODONE HCL 5 MG PO TABS
5.0000 mg | ORAL_TABLET | Freq: Once | ORAL | Status: DC | PRN
Start: 2020-06-29 — End: 2020-06-29

## 2020-06-29 MED ORDER — MIDAZOLAM HCL 5 MG/5ML IJ SOLN
INTRAMUSCULAR | Status: DC | PRN
  Administered 2020-06-29 (×2): 2 mg via INTRAVENOUS

## 2020-06-29 MED ORDER — PHENYLEPHRINE 40 MCG/ML (10ML) SYRINGE FOR IV PUSH (FOR BLOOD PRESSURE SUPPORT)
PREFILLED_SYRINGE | INTRAVENOUS | Status: AC
  Filled 2020-06-29: qty 10

## 2020-06-29 MED ORDER — LIDOCAINE HCL (PF) 1 % IJ SOLN
INTRAMUSCULAR | Status: DC | PRN
  Administered 2020-06-29: 5 mL

## 2020-06-29 MED ORDER — LIDOCAINE 2% (20 MG/ML) 5 ML SYRINGE
INTRAMUSCULAR | Status: AC
  Filled 2020-06-29: qty 5

## 2020-06-29 MED ORDER — LIDOCAINE HCL (PF) 1 % IJ SOLN
INTRAMUSCULAR | Status: AC
  Filled 2020-06-29: qty 30

## 2020-06-29 MED ORDER — MIDAZOLAM HCL 2 MG/2ML IJ SOLN
INTRAMUSCULAR | Status: AC
  Filled 2020-06-29: qty 2

## 2020-06-29 MED ORDER — BUPIVACAINE HCL (PF) 0.5 % IJ SOLN
INTRAMUSCULAR | Status: AC
  Filled 2020-06-29: qty 30

## 2020-06-29 MED ORDER — PHENYLEPHRINE 40 MCG/ML (10ML) SYRINGE FOR IV PUSH (FOR BLOOD PRESSURE SUPPORT)
PREFILLED_SYRINGE | INTRAVENOUS | Status: DC | PRN
  Administered 2020-06-29: 80 ug via INTRAVENOUS
  Administered 2020-06-29: 120 ug via INTRAVENOUS
  Administered 2020-06-29: 80 ug via INTRAVENOUS
  Administered 2020-06-29: 120 ug via INTRAVENOUS

## 2020-06-29 MED ORDER — GLYCOPYRROLATE 0.2 MG/ML IJ SOLN
INTRAMUSCULAR | Status: DC | PRN
  Administered 2020-06-29 (×2): .1 mg via INTRAVENOUS

## 2020-06-29 MED ORDER — FENTANYL CITRATE (PF) 100 MCG/2ML IJ SOLN
25.0000 ug | INTRAMUSCULAR | Status: DC | PRN
Start: 2020-06-29 — End: 2020-06-29

## 2020-06-29 MED ORDER — PROPOFOL 10 MG/ML IV BOLUS
INTRAVENOUS | Status: AC
  Filled 2020-06-29: qty 20

## 2020-06-29 MED ORDER — POVIDONE-IODINE 10 % EX SWAB
2.0000 "application " | Freq: Once | CUTANEOUS | Status: AC
  Administered 2020-06-29: 2 via TOPICAL

## 2020-06-29 MED ORDER — PROPOFOL 500 MG/50ML IV EMUL
INTRAVENOUS | Status: DC | PRN
  Administered 2020-06-29: 150 ug/kg/min via INTRAVENOUS

## 2020-06-29 MED ORDER — CEFAZOLIN SODIUM-DEXTROSE 2-4 GM/100ML-% IV SOLN
2.0000 g | INTRAVENOUS | Status: AC
  Administered 2020-06-29: 2 g via INTRAVENOUS

## 2020-06-29 MED ORDER — OXYCODONE HCL 5 MG/5ML PO SOLN: 5.0000 mg | Freq: Once | ORAL | Status: DC | PRN

## 2020-06-29 MED ORDER — LIDOCAINE 2% (20 MG/ML) 5 ML SYRINGE
INTRAMUSCULAR | Status: DC | PRN
  Administered 2020-06-29: 50 mg via INTRAVENOUS

## 2020-06-29 MED ORDER — LACTATED RINGERS IV SOLN: INTRAVENOUS | Status: DC

## 2020-06-29 MED ORDER — CEPHALEXIN 500 MG PO CAPS: 500.0000 mg | ORAL_CAPSULE | Freq: Four times a day (QID) | ORAL | 0 refills | Status: DC

## 2020-06-29 MED ORDER — FENTANYL CITRATE (PF) 250 MCG/5ML IJ SOLN
INTRAMUSCULAR | Status: AC
  Filled 2020-06-29: qty 5

## 2020-06-29 MED ORDER — CHLORHEXIDINE GLUCONATE 0.12 % MT SOLN
OROMUCOSAL | Status: AC
  Administered 2020-06-29: 15 mL
  Filled 2020-06-29: qty 15

## 2020-06-29 MED ORDER — 0.9 % SODIUM CHLORIDE (POUR BTL) OPTIME
TOPICAL | Status: DC | PRN
  Administered 2020-06-29: 1000 mL

## 2020-06-29 SURGICAL SUPPLY — 50 items
APL PRP STRL LF DISP 70% ISPRP (MISCELLANEOUS) ×1
BNDG CMPR 9X4 STRL LF SNTH (GAUZE/BANDAGES/DRESSINGS)
BNDG COHESIVE 2X5 TAN STRL LF (GAUZE/BANDAGES/DRESSINGS) ×1 IMPLANT
BNDG CONFORM 2 STRL LF (GAUZE/BANDAGES/DRESSINGS) ×2 IMPLANT
BNDG ELASTIC 4X5.8 VLCR STR LF (GAUZE/BANDAGES/DRESSINGS) ×2 IMPLANT
BNDG ESMARK 4X9 LF (GAUZE/BANDAGES/DRESSINGS) IMPLANT
BNDG GAUZE ELAST 4 BULKY (GAUZE/BANDAGES/DRESSINGS) ×6 IMPLANT
CAP PIN PROTECTOR ORTHO WHT (CAP) ×1 IMPLANT
CHLORAPREP W/TINT 26 (MISCELLANEOUS) ×2 IMPLANT
CORD BIPOLAR FORCEPS 12FT (ELECTRODE) ×2 IMPLANT
COVER SURGICAL LIGHT HANDLE (MISCELLANEOUS) ×2 IMPLANT
COVER WAND RF STERILE (DRAPES) ×2 IMPLANT
CUFF TOURN SGL QUICK 18X4 (TOURNIQUET CUFF) ×2 IMPLANT
DRAPE C-ARM MINI 42X72 WSTRAPS (DRAPES) ×1 IMPLANT
DRAPE U-SHAPE 47X51 STRL (DRAPES) ×2 IMPLANT
DRSG ADAPTIC 3X8 NADH LF (GAUZE/BANDAGES/DRESSINGS) ×1 IMPLANT
GAUZE SPONGE 4X4 12PLY STRL (GAUZE/BANDAGES/DRESSINGS) ×2 IMPLANT
GAUZE XEROFORM 1X8 LF (GAUZE/BANDAGES/DRESSINGS) ×1 IMPLANT
GAUZE XEROFORM 5X9 LF (GAUZE/BANDAGES/DRESSINGS) ×2 IMPLANT
GLOVE SRG 8 PF TXTR STRL LF DI (GLOVE) ×1 IMPLANT
GLOVE SURG SYN 7.5  E (GLOVE) ×2
GLOVE SURG SYN 7.5 E (GLOVE) ×1 IMPLANT
GLOVE SURG SYN 7.5 PF PI (GLOVE) ×1 IMPLANT
GLOVE SURG UNDER POLY LF SZ8 (GLOVE) ×2
GOWN STRL REUS W/ TWL LRG LVL3 (GOWN DISPOSABLE) ×1 IMPLANT
GOWN STRL REUS W/TWL LRG LVL3 (GOWN DISPOSABLE) ×2
GUIDEWIRE .045XTROC TIP LSR LN (WIRE) IMPLANT
K-WIRE 1.1 (WIRE) ×2
KIT BASIN OR (CUSTOM PROCEDURE TRAY) ×2 IMPLANT
KIT TURNOVER KIT B (KITS) ×2 IMPLANT
MANIFOLD NEPTUNE II (INSTRUMENTS) ×2 IMPLANT
NDL HYPO 25GX1X1/2 BEV (NEEDLE) IMPLANT
NEEDLE HYPO 25GX1X1/2 BEV (NEEDLE) IMPLANT
NS IRRIG 1000ML POUR BTL (IV SOLUTION) ×2 IMPLANT
PACK ORTHO EXTREMITY (CUSTOM PROCEDURE TRAY) ×2 IMPLANT
PAD ARMBOARD 7.5X6 YLW CONV (MISCELLANEOUS) ×2 IMPLANT
PAD CAST 4YDX4 CTTN HI CHSV (CAST SUPPLIES) ×1 IMPLANT
PADDING CAST COTTON 4X4 STRL (CAST SUPPLIES) ×2
SET CYSTO W/LG BORE CLAMP LF (SET/KITS/TRAYS/PACK) ×2 IMPLANT
SPONGE LAP 4X18 RFD (DISPOSABLE) ×2 IMPLANT
SUT CHROMIC 6 0 PS 4 (SUTURE) ×2 IMPLANT
SUT PROLENE 4 0 PS 2 18 (SUTURE) ×2 IMPLANT
SYR CONTROL 10ML LL (SYRINGE) ×1 IMPLANT
TOWEL GREEN STERILE (TOWEL DISPOSABLE) ×2 IMPLANT
TOWEL GREEN STERILE FF (TOWEL DISPOSABLE) ×2 IMPLANT
TUBE CONNECTING 12X1/4 (SUCTIONS) ×2 IMPLANT
TUBING TUR DISP (UROLOGICAL SUPPLIES) IMPLANT
UNDERPAD 30X36 HEAVY ABSORB (UNDERPADS AND DIAPERS) ×4 IMPLANT
WATER STERILE IRR 1000ML POUR (IV SOLUTION) ×2 IMPLANT
YANKAUER SUCT BULB TIP NO VENT (SUCTIONS) ×2 IMPLANT

## 2020-06-29 NOTE — Progress Notes (Signed)
Orthopedic Tech Progress Note Patient Details:  Dakota Lin [birth year] [id number]  Ortho Devices Type of Ortho Device: Arm sling Ortho Device/Splint Location: LUE Ortho Device/Splint Interventions: Application,Ordered   Post Interventions Patient Tolerated: Well   Jahson Emanuele A Shadasia Oldfield 06/29/2020, 10:01 AM

## 2020-06-29 NOTE — Anesthesia Postprocedure Evaluation (Signed)
Anesthesia Post Note  Patient: Dakota Lin  Procedure(s) Performed: IRRIGATION AND DEBRIDEMENT WOUND, nailbed reapir and surgical fixation of distal phalanx fracture (Left Finger)     Patient location during evaluation: PACU Anesthesia Type: MAC Level of consciousness: awake and alert Pain management: pain level controlled Vital Signs Assessment: post-procedure vital signs reviewed and stable Respiratory status: spontaneous breathing, nonlabored ventilation, respiratory function stable and patient connected to nasal cannula oxygen Cardiovascular status: stable and blood pressure returned to baseline Postop Assessment: no apparent nausea or vomiting Anesthetic complications: no   No complications documented.  Last Vitals:  Vitals:   06/29/20 0910 06/29/20 0926  BP: 108/75 (!) 115/95  Pulse: 64 79  Resp: 13 18  Temp:  (!) 36.1 C  SpO2: 98% 95%    Last Pain:  Vitals:   06/29/20 0926  TempSrc:   PainSc: 0-No pain                 Thaer Miyoshi S

## 2020-06-29 NOTE — Transfer of Care (Signed)
Immediate Anesthesia Transfer of Care Note  Patient: Dakota Lin  Procedure(s) Performed: IRRIGATION AND DEBRIDEMENT WOUND, nailbed reapir and surgical fixation of distal phalanx fracture (Left Finger)  Patient Location: PACU  Anesthesia Type:MAC  Level of Consciousness: drowsy  Airway & Oxygen Therapy: Patient Spontanous Breathing and Patient connected to face mask oxygen  Post-op Assessment: Report given to RN and Post -op Vital signs reviewed and stable  Post vital signs: Reviewed and stable  Last Vitals:  Vitals Value Taken Time  BP 97/69 06/29/20 0855  Temp    Pulse 71 06/29/20 0855  Resp 13 06/29/20 0855  SpO2 98 % 06/29/20 0855  Vitals shown include unvalidated device data.  Last Pain:  Vitals:   06/29/20 0724  TempSrc:   PainSc: 7       Patients Stated Pain Goal: 3 (48/47/20 7218)  Complications: No complications documented.

## 2020-06-29 NOTE — Anesthesia Preprocedure Evaluation (Signed)
Anesthesia Evaluation  Patient identified by MRN, date of birth, ID band Patient awake    Reviewed: Allergy & Precautions, H&P , NPO status , Patient's Chart, lab work & pertinent test results  Airway Mallampati: II  TM Distance: >3 FB Neck ROM: Full    Dental no notable dental hx.    Pulmonary neg pulmonary ROS,    Pulmonary exam normal breath sounds clear to auscultation       Cardiovascular negative cardio ROS Normal cardiovascular exam Rhythm:Regular Rate:Normal     Neuro/Psych negative neurological ROS  negative psych ROS   GI/Hepatic negative GI ROS, Neg liver ROS,   Endo/Other  negative endocrine ROS  Renal/GU negative Renal ROS  negative genitourinary   Musculoskeletal negative musculoskeletal ROS (+)   Abdominal   Peds negative pediatric ROS (+)  Hematology negative hematology ROS (+)   Anesthesia Other Findings   Reproductive/Obstetrics negative OB ROS                             Anesthesia Physical Anesthesia Plan  ASA: II  Anesthesia Plan: MAC   Post-op Pain Management:    Induction: Intravenous  PONV Risk Score and Plan: 1 and Ondansetron  Airway Management Planned: Simple Face Mask  Additional Equipment:   Intra-op Plan:   Post-operative Plan:   Informed Consent: I have reviewed the patients History and Physical, chart, labs and discussed the procedure including the risks, benefits and alternatives for the proposed anesthesia with the patient or authorized representative who has indicated his/her understanding and acceptance.     Dental advisory given  Plan Discussed with: CRNA and Surgeon  Anesthesia Plan Comments:         Anesthesia Quick Evaluation

## 2020-06-29 NOTE — Op Note (Signed)
PREOPERATIVE DIAGNOSIS: Left ring and small finger crush injury  POSTOPERATIVE DIAGNOSIS: Left ring and small finger crush injury with open ring finger distal phalanx fracture and nailbed laceration, small finger nailbed laceration  ATTENDING PHYSICIAN: Maudry Mayhew. Jeannie Fend, III, MD who was present and scrubbed for the entire case   ASSISTANT SURGEON: None.   ANESTHESIA: MAC with digital block  SURGICAL PROCEDURES: 1.  Irrigation and excisional debridement of the left ring finger open distal phalanx fracture including debridement of skin, subcutaneous tissue and bone 2.  Open treatment of left ring finger distal phalanx fracture 3.  Left ring finger nailbed repair 4.  Left small finger nailbed repair  SURGICAL INDICATIONS: Patient is a 50 year old male who last week had a crush injury to his left hand while at work.  He sustained injuries to the ring and small finger was subsequently seen in [facility], ER.  He was found to have open distal phalanx fracture with nailbed laceration to the left ring finger as well as a nailbed laceration to the left small finger.  His wounds were cleaned and provisionally closed in the ER he was sent to see me in clinic.  I did recommend proceeding forward with surgical fixation of his left ring finger secondary to persistent displacement of his distal phalanx fracture and open nature of his injuries.  He presents today for operative treatment.  FINDING: Left ring finger with open, displaced distal phalanx fracture with transverse laceration through the nailbed.  The laceration extended radially into the pulp of the digit is intact along the ulnar pedicle.  Successful irrigation and debridement of the digit as well as fixation of the distal phalanx fracture and nailbed was performed.  Additionally there was a laceration along the radial aspect of the small finger.  it had a associated fracture of the nail plate.  It did extend into the radial portion of the nailbed  which was successfully repaired as well.  DESCRIPTION OF PROCEDURE: Patient was identified in the preop holding area where the risk benefits and alternatives of the procedure were once again discussed with the patient.  This was aided with the use of a Spanish interpreter.  These risks include but are not limited to infection, bleeding, damage to surrounding structures including blood vessels and nerves, pain, stiffness, malunion, nonunion, implant failure, nail plate deformity and need for additional procedures.  Informed consent was obtained at that time and the patient's left hand was marked with a surgical marking pen.  He was then brought back to the operative suite where timeout was performed identifying the correct patient operative site.  He was positioned supine on the operative table with his hand outstretched on a hand table.  The patient was induced under MAC sedation.  A combination of 1% lidocaine and half percent bupivacaine were then injected in the palm at the base of the ring and small finger.  This provided full anesthesia to the digits through the procedure.  The left hand was prepped using ChloraPrep and draped in usual sterile fashion.  A Penrose drain was placed around the base of the ring finger to act as a digital tourniquet.  Previous sutures were removed through the fingertip.  A Freer elevator was passed deep to the nail plate which was removed at its entirety.  This exposed a transverse laceration through the nailbed with a displaced, open distal phalanx fracture.  The laceration extended radially into the pulp of the digit.  An excisional debridement of the digit was  then performed.  This was done using scissors as well as a rondure.  Some necrotic tissue around the skin edges was removed sharply and a rondure was used to gently debride both the proximal and distal aspects of the open distal phalanx fracture.  Once thorough debridement had been performed, the wound was copiously  irrigated with approximately 500 mL of normal saline via bulb syringe.  A 0.045 K wire was then inserted antegrade through the open wound into the distal portion of the distal phalanx.  This exited out the tip of the digit.  The distal phalanx fracture was then reduced under open direct visualization.  The K wire was then advanced retrograde into the proximal portion of the distal phalanx and crossing the DIP joint.  PA and lateral fluoroscopic images were obtained which showed anatomic positioning of the distal phalanx fracture and a K wire down the central portion of the distal phalanx and crossing the DIP joint.  The pin was cut external to the skin and a pin cap was placed.  Multiple 4-0 Prolene sutures were then used to reapproximate and close the laceration which extended into the pulp.  Attention was then turned to the nailbed.  Multiple 6-0 Chromic Gut sutures were then placed into the nailbed, creating a smooth anatomically aligned nailbed surface.  Attention was then turned to the small finger.  A Penrose drain was also placed around the base of this digit to act as a digital tourniquet.  There was a fracture through the nail plate in its midportion.  A Freer elevator was passed palmar to the nail plate distally and it was removed.  This exposed laceration to the radial aspect of the finger pulp which did extend into the radial aspect of the nailbed.  The wound was copiously irrigated with normal saline.  The pulp tissue was closed with interrupted 4-0 Prolene sutures the nailbed was reapproximated radially using 6-0 Chromic Gut sutures.  Adaptic was placed on the nailbed underneath the proximal eponychial fold of the ring finger.  Xeroform, 4 x 4's and well-padded dressing incorporating both the ring and small finger was then placed.  A digital splint was placed immobilizing the ring finger.  The tourniquets were released prior to dressing application and there was return of brisk capillary refill  to both digits.  The patient was awoken this sedation and taken to the PACU in stable condition.  He tolerated the procedure well and there were no complications.  RADIOGRAPHIC INTERPRETATION: PA and lateral radiographs were obtained intraoperatively under fluoroscopic images of the ring and small finger.  This shows anatomic reduction of the previously displaced ring finger distal phalanx fracture with K wire fixation crossing the DIP joint.  No fractures or dislocations are noted to the small finger.  ESTIMATED BLOOD LOSS: 10 mL  TOURNIQUET TIME: Less than an hour  SPECIMENS: None  POSTOPERATIVE PLAN: Patient will be discharged home today and seen back in clinic in approximately 7 to 10 days.  We will remove his dressings at that time and transition him to removable dressings with therapy as well as to protect her splint.  He can begin range of motion exercises of the digits.  Plan on removing his K wire at approximately 4 to 6 weeks postoperatively pending his clinical improvement.  IMPLANTS: 0.045 K wire x1

## 2020-06-29 NOTE — H&P (Signed)
ORTHOPAEDIC H&P  PCP:  Lezlie Lye, Meda Coffee, MD  Chief Complaint: Left ring and small finger crush injuries  HPI: Dakota Dakota is a 50 y.o. male who complains of left ring and small finger crush injuries.  Patient was at work when his fingertips to the ring and small finger were crushed.  This happened last week.  He was seen in the [facility], ER where he was found to have a nailbed laceration and distal phalanx fracture of the ring finger as well as a crush injury with a nailbed laceration to the small finger.  He was subsequently irrigated and loosely closed in the ER and sent to see me in clinic.  I did recommend proceeding forward with formal irrigation debridement and fixation of his ring and small fingers including nailbed repair, distal phalanx fixation as well as any other surgeries as indicated.  He presents today for operative management.  Past Medical History:  Diagnosis Date  . HLD (hyperlipidemia)    History reviewed. No pertinent surgical history. Social History   Socioeconomic History  . Marital status: Married    Spouse name: Not on file  . Number of children: Not on file  . Years of education: Not on file  . Highest education level: Not on file  Occupational History  . Not on file  Tobacco Use  . Smoking status: Never Smoker  . Smokeless tobacco: Never Used  Vaping Use  . Vaping Use: Never used  Substance and Sexual Activity  . Alcohol use: Yes    Comment: occ  . Drug use: No  . Sexual activity: Yes  Other Topics Concern  . Not on file  Social History Narrative  . Not on file   Social Determinants of Health   Financial Resource Strain: Not on file  Food Insecurity: Not on file  Transportation Needs: Not on file  Physical Activity: Not on file  Stress: Not on file  Social Connections: Not on file   Family History  Problem Relation Age of Onset  . Stroke Father    No Known Allergies Prior to Admission medications   Medication Sig Start  Date End Date Taking? Authorizing Provider  aspirin EC 81 MG tablet Take 81 mg by mouth 4 (four) times a week. Swallow whole.   Yes [provider]  cephALEXin (KEFLEX) 500 MG capsule Take 1 capsule (500 mg total) by mouth 4 (four) times daily. 06/22/20  Yes Fayrene Helper, PA-C  ezetimibe (ZETIA) 10 MG tablet Take 10 mg by mouth daily. 02/25/20  Yes [provider]  oxyCODONE-acetaminophen (PERCOCET) 5-325 MG tablet Take 1 tablet by mouth every 6 (six) hours as needed for moderate pain or severe pain. 06/22/20  Yes Fayrene Helper, PA-C  triamcinolone cream (KENALOG) 0.1 % Apply 1 application topically 2 (two) times daily. Patient not taking: Reported on 06/26/2020 10/19/18   Linus Mako B, NP   No results found.  Positive ROS: All other systems have been reviewed and were otherwise negative with the exception of those mentioned in the HPI and as above.  Physical Exam: General: Alert, no acute distress Cardiovascular: No edema Respiratory: No cyanosis, no use of accessory musculature Skin: Lacerations to the tips of the ring and small finger with sutures intact Psychiatric: Patient is competent for consent with normal mood and affect  MUSCULOSKELETAL: Examination of the left hand shows lacerations to the ring and small finger with sutures intact.  There is obvious injury to the nail plates to  both the ring and small finger.  No swelling, erythema or signs of infection.  Tips of both warm well perfused with brisk capillary refill.  Intact gentle sensation to the tips of both the ring and small finger.  Assessment: Left ring and small finger crush injuries  Plan: Proceed forward with irrigation debridement of the left ring and small finger.  Plan to.  Perform nailbed repairs to both digits as well as fixation of distal phalanx to the ring finger.  Risks of surgery were discussed with the patient once again through the aid of an interpreter in preop area.  These risks include but  are not limited to infection, bleeding, damage to surrounding structures including blood vessels and nerves, pain, stiffness, malunion, nonunion, implant failure, nail deformity and need for additional procedures.  Informed consent was obtained the patient's left hand was marked.  Plan for discharge home postoperatively and follow-up as needed approximately 1 to 2 weeks.    Ernest Mallick, MD [phone]   06/29/2020 7:21 AM

## 2020-06-29 NOTE — Progress Notes (Signed)
Assessment performed by Laurell Roof, RN. Report given to Jana Half, RN.

## 2020-06-29 NOTE — Discharge Instructions (Signed)
Discharge Instructions  - Keep dressings in place. Do not remove them. - The dressings must stay dry - Take all medication as prescribed. Transition to over the counter pain medication as your pain improves - Keep the hand elevated over the next 48-72 hours to help with pain and swelling - Move all digits not restricted by the dressings regularly to prevent stiffness - Please call to schedule a follow up appointment with Dr. Chealsey Miyamoto and therapy at [phone] for 7-10 days following surgery - Your pain medication have been sent digitally to your pharmacy  

## 2020-06-30 ENCOUNTER — Encounter (HOSPITAL_COMMUNITY): Payer: Self-pay | Admitting: Orthopaedic Surgery

## 2020-11-23 ENCOUNTER — Encounter: Payer: Self-pay | Admitting: Gastroenterology

## 2020-12-26 ENCOUNTER — Ambulatory Visit (AMBULATORY_SURGERY_CENTER): Payer: Self-pay

## 2020-12-26 ENCOUNTER — Other Ambulatory Visit: Payer: Self-pay

## 2020-12-26 VITALS — Ht 65.0 in | Wt 183.0 lb

## 2020-12-26 DIAGNOSIS — Z1211 Encounter for screening for malignant neoplasm of colon: Secondary | ICD-10-CM

## 2020-12-26 MED ORDER — NA SULFATE-K SULFATE-MG SULF 17.5-3.13-1.6 GM/177ML PO SOLN: 1.0000 | Freq: Once | ORAL | 0 refills | Status: AC

## 2020-12-26 NOTE — Progress Notes (Signed)
Spanish interpreter present for PV appt- Byan  No egg or soy allergy known to patient  No issues with past sedation with any surgeries or procedures-- no past surgical procedures Patient denies ever being told they had issues or difficulty with intubation - no past surgeries No FH of Malignant Hyperthermia No diet pills per patient No home 02 use per patient  No blood thinners per patient  Pt denies issues with constipation  No A fib or A flutter   COVID 19 guidelines implemented in PV today with Pt and RN   NO PA's for preps discussed with pt in PV today  Discussed with pt there will be an out-of-pocket cost for prep and that varies from $0 to 70 dollars   Due to the COVID-19 pandemic we are asking patients to follow certain guidelines.  Pt aware of COVID protocols and [facility] guidelines

## 2021-01-04 ENCOUNTER — Telehealth: Payer: Self-pay | Admitting: Gastroenterology

## 2021-01-04 NOTE — Telephone Encounter (Signed)
Hi Dr. Tomasa Rand, this patient just called to cancel procedure that was scheduled on 01/09/21 because he was not able to get the day off at work. Patient has rescheduled to 01/18/21. Thank you.

## 2021-01-09 ENCOUNTER — Encounter: Payer: 59 | Admitting: Gastroenterology

## 2021-01-18 ENCOUNTER — Encounter: Payer: Self-pay | Admitting: Gastroenterology

## 2021-01-18 ENCOUNTER — Ambulatory Visit (AMBULATORY_SURGERY_CENTER): Payer: 59 | Admitting: Gastroenterology

## 2021-01-18 ENCOUNTER — Other Ambulatory Visit: Payer: Self-pay

## 2021-01-18 VITALS — BP 137/89 | HR 57 | Temp 97.3°F | Resp 16 | Ht 65.0 in | Wt 183.0 lb

## 2021-01-18 DIAGNOSIS — Z1211 Encounter for screening for malignant neoplasm of colon: Secondary | ICD-10-CM | POA: Diagnosis not present

## 2021-01-18 MED ORDER — SODIUM CHLORIDE 0.9 % IV SOLN: 500.0000 mL | Freq: Once | INTRAVENOUS | Status: DC

## 2021-01-18 NOTE — Progress Notes (Signed)
To PACU, VSS. Report to [email or web address] 

## 2021-01-18 NOTE — Progress Notes (Signed)
[facility] History and Physical   Primary Care Physician:  Penelope Galas, MD   Reason for Procedure:   Colon cancer screening  Plan:    Screening colonoscopy     HPI: Dakota Lin is a 50 y.o. male with a history of HLD and arthritis here for initial average risk screening colonoscopy.  He has no chronic GI symptoms and no family history of colon cancer.   Past Medical History:  Diagnosis Date   Arthritis    LEFT shoulder   HLD (hyperlipidemia)    on meds    Past Surgical History:  Procedure Laterality Date   INCISION AND DRAINAGE OF WOUND Left 06/29/2020   Procedure: IRRIGATION AND DEBRIDEMENT WOUND, nailbed reapir and surgical fixation of distal phalanx fracture;  Surgeon: Ernest Mallick, MD;  Location: [facility] OR;  Service: Orthopedics;  Laterality: Left;  with local anesthesia    Prior to Admission medications   Medication Sig Start Date End Date Taking? Authorizing Provider  acetaminophen (TYLENOL) 650 MG CR tablet Take 650 mg by mouth every 8 (eight) hours as needed for pain.   Yes [provider]  aspirin EC 81 MG tablet Take 81 mg by mouth 4 (four) times a week. Swallow whole.    [provider]  ezetimibe (ZETIA) 10 MG tablet Take 10 mg by mouth daily. 02/25/20   [provider]    Current Outpatient Medications  Medication Sig Dispense Refill   acetaminophen (TYLENOL) 650 MG CR tablet Take 650 mg by mouth every 8 (eight) hours as needed for pain.     aspirin EC 81 MG tablet Take 81 mg by mouth 4 (four) times a week. Swallow whole.     ezetimibe (ZETIA) 10 MG tablet Take 10 mg by mouth daily.     Current Facility-Administered Medications  Medication Dose Route Frequency Provider Last Rate Last Admin   0.9 %  sodium chloride infusion  500 mL Intravenous Once Jenel Lucks, MD        Allergies as of 01/18/2021   (No Known Allergies)    Family History  Problem Relation Age of Onset   Stroke Father     Colon polyps Neg Hx    Colon cancer Neg Hx    Esophageal cancer Neg Hx    Rectal cancer Neg Hx    Stomach cancer Neg Hx     Social History   Socioeconomic History   Marital status: Married    Spouse name: Not on file   Number of children: Not on file   Years of education: Not on file   Highest education level: Not on file  Occupational History   Not on file  Tobacco Use   Smoking status: Never   Smokeless tobacco: Never  Vaping Use   Vaping Use: Never used  Substance and Sexual Activity   Alcohol use: Yes    Alcohol/week: 7.0 standard drinks    Types: 7 Standard drinks or equivalent per week    Comment: occ   Drug use: No   Sexual activity: Yes  Other Topics Concern   Not on file  Social History Narrative   Not on file   Social Determinants of Health   Financial Resource Strain: Not on file  Food Insecurity: Not on file  Transportation Needs: Not on file  Physical Activity: Not on file  Stress: Not on file  Social Connections: Not on file  Intimate Partner Violence: Not on file    Review  of Systems:  All other review of systems negative except as mentioned in the HPI.  Physical Exam: Vital signs BP 126/89   Pulse 65   Temp (!) 97.3 F (36.3 C) (Temporal)   Ht 5\' 5"  (1.651 m)   Wt 183 lb (83 kg)   SpO2 98%   BMI 30.45 kg/m   General:   Alert,  Well-developed, well-nourished, pleasant and cooperative in NAD Lungs:  Clear throughout to auscultation.   Heart:  Regular rate and rhythm; no murmurs, clicks, rubs,  or gallops. Abdomen:  Soft, nontender and nondistended. Normal bowel sounds.   Neuro/Psych:  Normal mood and affect. A and O x 3   Jiovanna Frei E. , MD [facility]

## 2021-01-18 NOTE — Progress Notes (Signed)
VS- Dakota Lin  Interpreter used today at the [facility] for this pt.  Interpreter's name is- Judie Grieve  Pt's states no medical or surgical changes since previsit or office visit.

## 2021-01-18 NOTE — Patient Instructions (Signed)
USTED TUVO UN PROCEDIMIENTO ENDOSCPICO HOY EN EL [facility]:   Lea el informe del procedimiento que se le entreg para cualquier pregunta especfica sobre lo que se encontr durante su examen.  Si el informe del examen no responde a sus preguntas, por favor llame a su gastroenterlogo para aclararlo.  Si usted solicit que no se le den detalles de lo que se encontr en su procedimiento al acompaante que le va a cuidar, entonces el informe del procedimiento se ha incluido en un sobre sellado para que usted lo revise despus cuando le sea ms conveniente.   LO QUE PUEDE ESPERAR: Algunas sensaciones de hinchazn en el abdomen.  Puede tener ms gases de lo normal.  El caminar puede ayudarle a eliminar el aire que se le puso en el tracto gastrointestinal durante el procedimiento y reducir la hinchazn.  Si le hicieron una endoscopia inferior (como una colonoscopia o una sigmoidoscopia flexible), podra notar manchas de sangre en las heces fecales o en el papel higinico.  Si se someti a una preparacin intestinal para su procedimiento, es posible que no tenga una evacuacin intestinal normal durante algunos das.   Tenga en cuenta:  Es posible que note un poco de irritacin y congestin en la nariz o algn drenaje.  Esto es debido al oxgeno utilizado durante su procedimiento.  No hay que preocuparse y esto debe desaparecer ms o menos en un da.   SNTOMAS PARA REPORTAR INMEDIATAMENTE:  Despus de una endoscopia inferior (colonoscopia o sigmoidoscopia flexible):  Cantidades excesivas de sangre en las heces fecales  Sensibilidad significativa o empeoramiento de los dolores abdominales   Hinchazn aguda del abdomen que antes no tena   Fiebre de 100F o ms    Para asuntos urgentes o de emergencia, puede comunicarse con un gastroenterlogo a cualquier hora llamando al [phone].  DIETA:  Recomendamos una comida pequea al principio, pero luego puede continuar con su dieta normal.   Tome muchos lquidos, pero debe evitar las bebidas alcohlicas durante 24 horas.    ACTIVIDAD:  Debe planear tomarse las cosas con calma por el resto del da y no debe CONDUCIR ni usar maquinaria pesada hasta maana (debido a los medicamentos de sedacin utilizados durante el examen).     SEGUIMIENTO: Nuestro personal llamar al nmero que aparece en su historial al siguiente da hbil de su procedimiento para ver cmo se siente y para responder cualquier pregunta o inquietud que pueda tener con respecto a la informacin que se le dio despus del procedimiento. Si no podemos contactarle, le dejaremos un mensaje.  Sin embargo, si se siente bien y no tiene ningn problema, no es necesario que nos devuelva la llamada.  Asumiremos que ha regresado a sus actividades diarias normales sin incidentes. Si se le tomaron algunas biopsias, le contactaremos por telfono o por carta en las prximas 3 semanas.  Si no ha sabido nada sobre las biopsias en el transcurso de 3 semanas, por favor llmenos al [phone].   FIRMAS/CONFIDENCIALIDAD: Usted y/o el acompaante que le cuide han firmado documentos que se ingresarn en su historial mdico electrnico.  Estas firmas atestiguan el hecho de que la informacin anterior  

## 2021-01-18 NOTE — Op Note (Signed)
[facility] Patient Name: Dakota Lin Procedure Date: 01/18/2021 11:12 AM MRN: [id number] Endoscopist: Lorin Picket E. Tomasa Rand , MD Age: 50 Referring MD:  Date of Birth: [birth year] Gender: Male Account #: [id number] Procedure:                Colonoscopy Indications:              Screening for colorectal malignant neoplasm, This                            is the patient's first colonoscopy Medicines:                Monitored Anesthesia Care Procedure:                Pre-Anesthesia Assessment:                           - Prior to the procedure, a History and Physical                            was performed, and patient medications and                            allergies were reviewed. The patient's tolerance of                            previous anesthesia was also reviewed. The risks                            and benefits of the procedure and the sedation                            options and risks were discussed with the patient.                            All questions were answered, and informed consent                            was obtained. Prior Anticoagulants: The patient has                            taken no previous anticoagulant or antiplatelet                            agents. ASA Grade Assessment: II - A patient with                            mild systemic disease. After reviewing the risks                            and benefits, the patient was deemed in                            satisfactory condition to undergo the procedure.  After obtaining informed consent, the colonoscope                            was passed under direct vision. Throughout the                            procedure, the patient's blood pressure, pulse, and                            oxygen saturations were monitored continuously. The                            Olympus CF-HQ190L ([id number]) Colonoscope was                            introduced through the anus  and advanced to the the                            cecum, identified by appendiceal orifice and                            ileocecal valve. The colonoscopy was performed                            without difficulty. The patient tolerated the                            procedure well. The quality of the bowel                            preparation was excellent. The ileocecal valve,                            appendiceal orifice, and rectum were photographed. Scope In: 11:22:42 AM Scope Out: 11:34:52 AM Scope Withdrawal Time: 0 hours 8 minutes 40 seconds  Total Procedure Duration: 0 hours 12 minutes 10 seconds  Findings:                 The perianal and digital rectal examinations were                            normal. Pertinent negatives include normal                            sphincter tone and no palpable rectal lesions.                           Multiple small and large-mouthed diverticula were                            found in the sigmoid colon and descending colon.                           The exam was otherwise normal throughout the  examined colon.                           Anal papilla(e) were hypertrophied.                           No additional abnormalities were found on                            retroflexion. Complications:            No immediate complications. Estimated Blood Loss:     Estimated blood loss: none. Impression:               - Diverticulosis in the sigmoid colon and in the                            descending colon.                           - Anal papilla(e) were hypertrophied.                           - No specimens collected. Recommendation:           - Patient has a contact number available for                            emergencies. The signs and symptoms of potential                            delayed complications were discussed with the                            patient. Return to normal activities tomorrow.                             Written discharge instructions were provided to the                            patient.                           - Resume previous diet.                           - Continue present medications.                           - Repeat colonoscopy in 10 years for screening                            purposes. Pegi Milazzo E. Tomasa Rand, MD 01/18/2021 11:38:47 AM This report has been signed electronically.

## 2021-01-22 ENCOUNTER — Telehealth: Payer: Self-pay | Admitting: *Deleted

## 2021-01-22 NOTE — Telephone Encounter (Signed)
Patient returned your follow-up call after his procedure. He stated to be feeling fine.  

## 2021-01-22 NOTE — Telephone Encounter (Signed)
Follow up call made. 

## 2021-01-22 NOTE — Telephone Encounter (Signed)
  Follow up Call-  Call back number 01/18/2021  Post procedure Call Back phone  # [phone]  Permission to leave phone message Yes  Some recent data might be hidden     Patient questions:  Do you have a fever, pain , or abdominal swelling? No. Pain Score  0 *  Have you tolerated food without any problems? Yes.    Have you been able to return to your normal activities? Yes.    Do you have any questions about your discharge instructions: Diet   No. Medications  No. Follow up visit  No.  Do you have questions or concerns about your Care? No.  Actions: * If pain score is 4 or above: No action needed, pain <4.Have you developed a fever since your procedure? no  2.   Have you had an respiratory symptoms (SOB or cough) since your procedure? no  3.   Have you tested positive for COVID 19 since your procedure no  4.   Have you had any family members/close contacts diagnosed with the COVID 19 since your procedure?  no   If yes to any of these questions please route to Laverna Peace, RN and Karlton Lemon, RN

## 2022-01-05 IMAGING — CR DG HAND COMPLETE 3+V*L*
3 series · 3 of 3 positions shown · non-contrast
Comparison: None.

CLINICAL DATA: Blunt trauma to the fourth and fifth digits, initial
encounter.

EXAM:
LEFT HAND - COMPLETE 3+ VIEW

[hand pa]
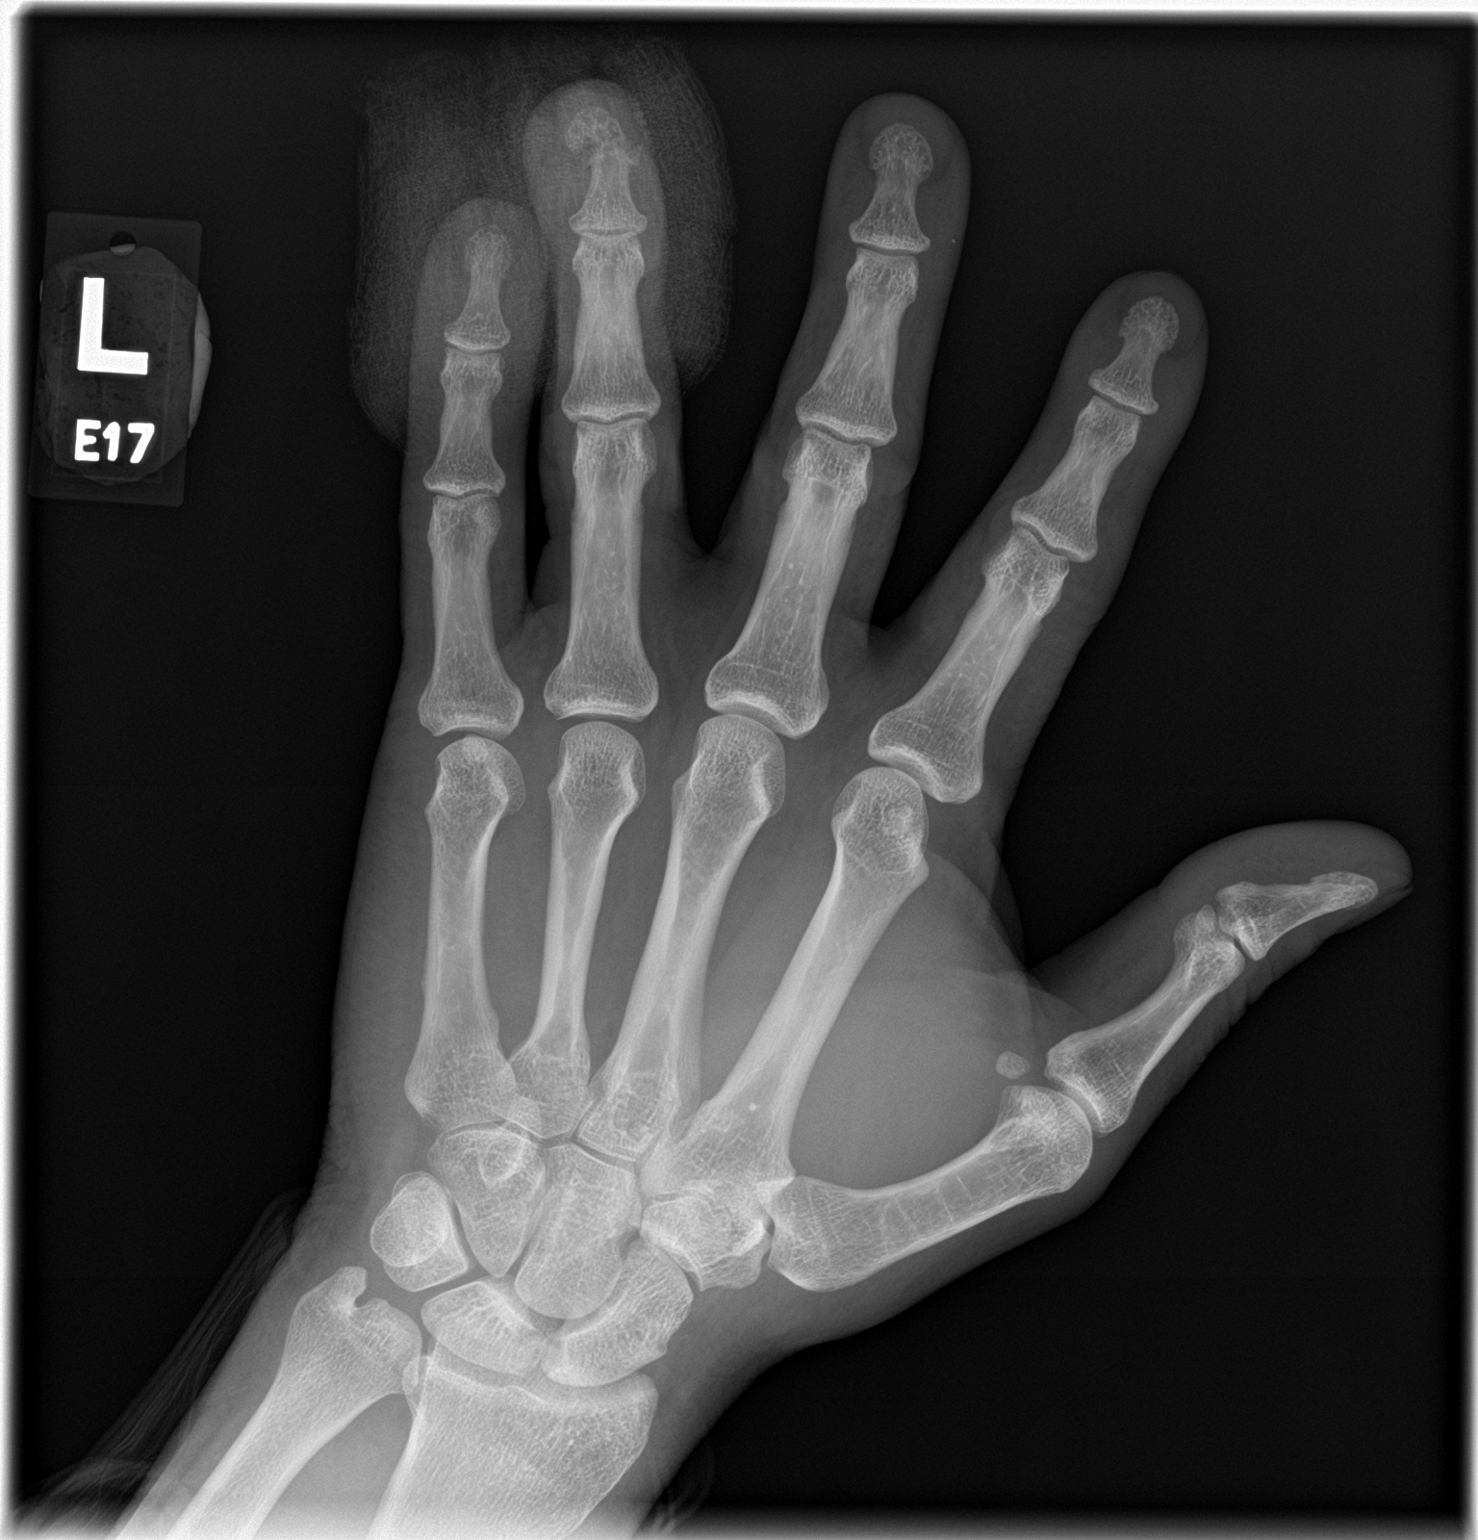

[hand obl]
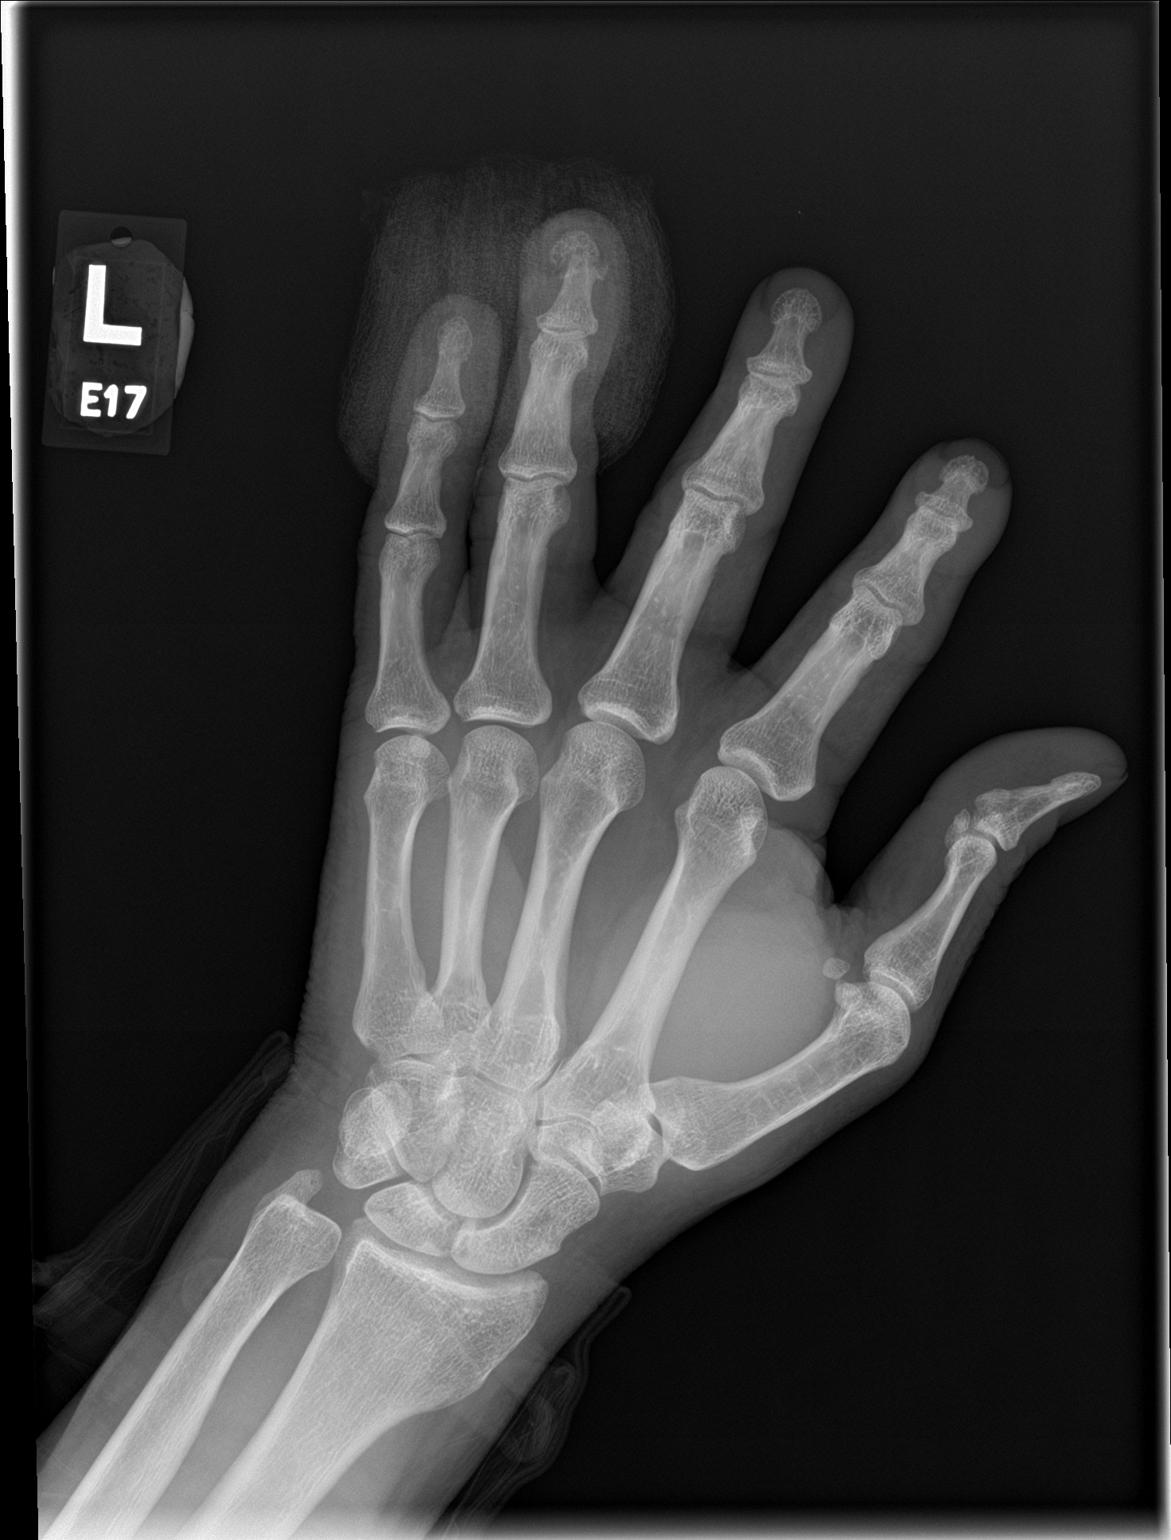

[hand lat]
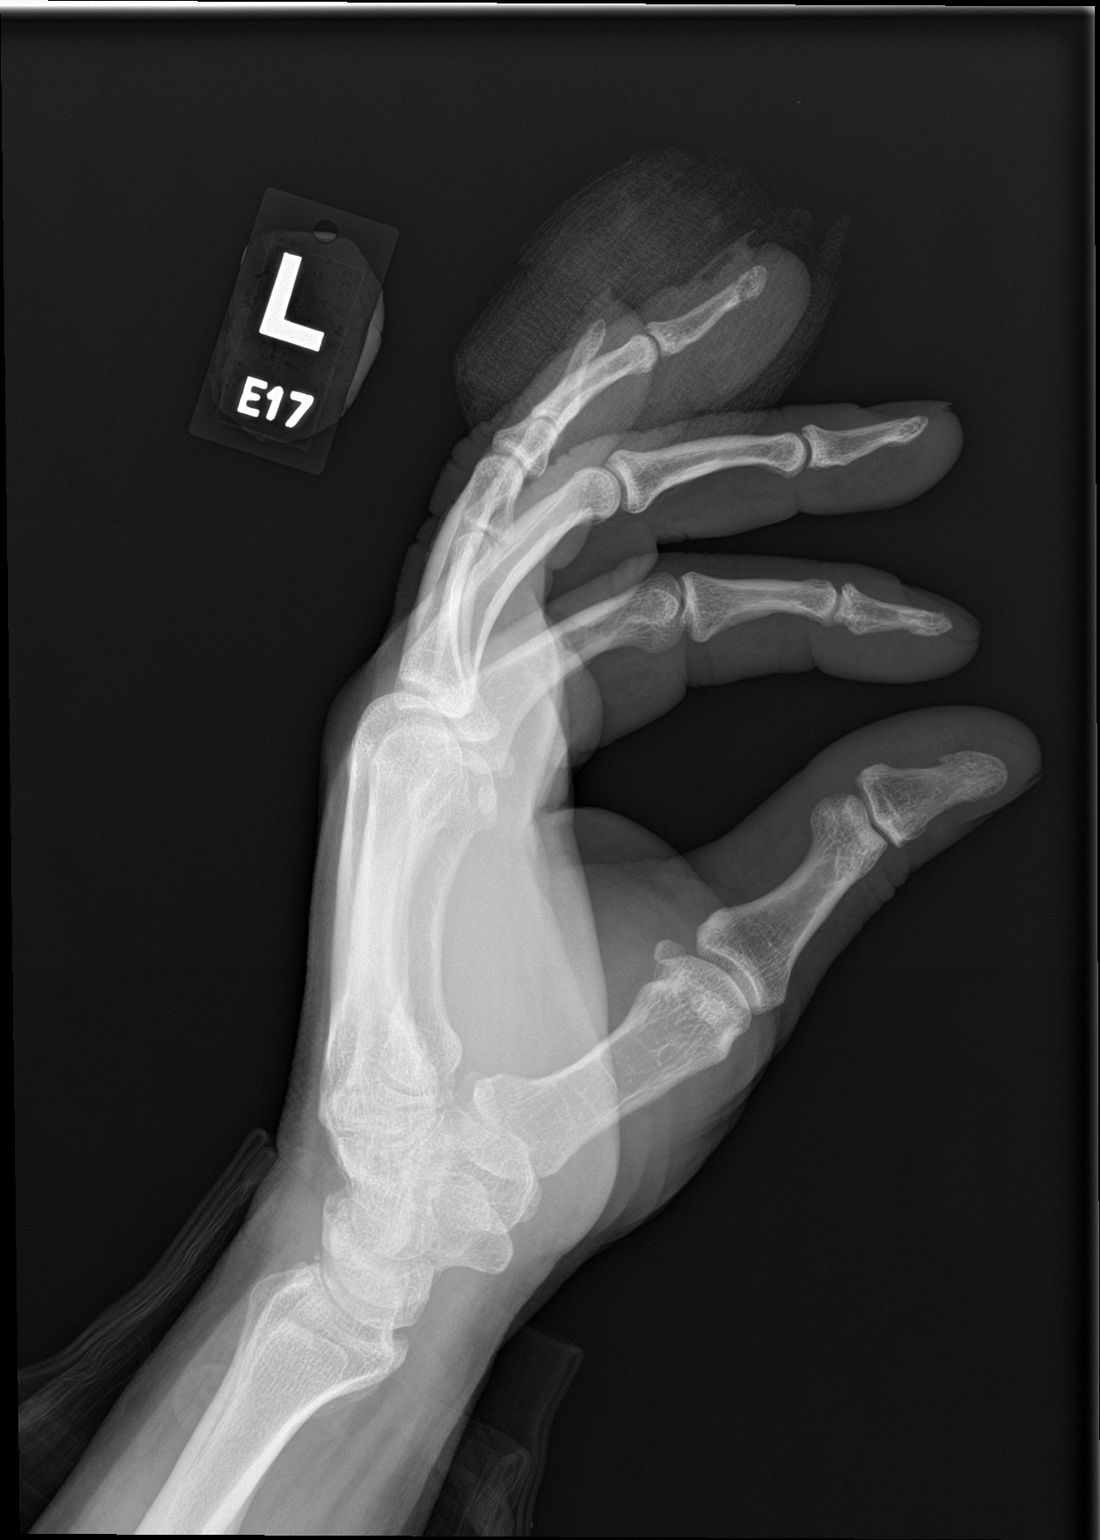

[3 of 3 positions shown; findings below may reference images not displayed]

FINDINGS: Transverse fracture is noted through the fourth distal phalanx with
mild displacement medially. Mild soft tissue swelling is noted in
the fourth and fifth digits. No other fracture is seen. No foreign
body is noted.
IMPRESSION: Fourth distal phalangeal fracture. Soft tissue swelling in the
fourth and fifth digits is noted.
